# Patient Record
Sex: Female | Born: 1945 | Race: White | Hispanic: No | Marital: Married | Smoking: Former smoker
Health system: Southern US, Community
[De-identification: ages and names within clinical notes are randomized; demographics above are authoritative.]

## PROBLEM LIST (undated history)

## (undated) DIAGNOSIS — J449 Chronic obstructive pulmonary disease, unspecified: Secondary | ICD-10-CM

## (undated) DIAGNOSIS — I4891 Unspecified atrial fibrillation: Secondary | ICD-10-CM

## (undated) DIAGNOSIS — I82409 Acute embolism and thrombosis of unspecified deep veins of unspecified lower extremity: Secondary | ICD-10-CM

## (undated) DIAGNOSIS — I10 Essential (primary) hypertension: Secondary | ICD-10-CM

## (undated) HISTORY — PX: LOWER EXTREMITY ANGIOGRAM: SHX5955

## (undated) HISTORY — PX: APPENDECTOMY: SHX54

## (undated) HISTORY — PX: TONSILLECTOMY: SUR1361

---

## 2019-07-28 DIAGNOSIS — Z880 Allergy status to penicillin: Secondary | ICD-10-CM | POA: Insufficient documentation

## 2019-07-28 DIAGNOSIS — Z6833 Body mass index (BMI) 33.0-33.9, adult: Secondary | ICD-10-CM | POA: Diagnosis not present

## 2019-07-28 DIAGNOSIS — Z9119 Patient's noncompliance with other medical treatment and regimen: Secondary | ICD-10-CM | POA: Insufficient documentation

## 2019-07-28 DIAGNOSIS — E669 Obesity, unspecified: Secondary | ICD-10-CM | POA: Insufficient documentation

## 2019-07-28 DIAGNOSIS — I10 Essential (primary) hypertension: Secondary | ICD-10-CM | POA: Diagnosis not present

## 2019-07-28 DIAGNOSIS — I998 Other disorder of circulatory system: Secondary | ICD-10-CM | POA: Diagnosis not present

## 2019-07-28 DIAGNOSIS — Z7901 Long term (current) use of anticoagulants: Secondary | ICD-10-CM | POA: Diagnosis not present

## 2019-07-28 DIAGNOSIS — Z20822 Contact with and (suspected) exposure to covid-19: Secondary | ICD-10-CM | POA: Diagnosis not present

## 2019-07-28 DIAGNOSIS — G4733 Obstructive sleep apnea (adult) (pediatric): Secondary | ICD-10-CM | POA: Insufficient documentation

## 2019-07-28 DIAGNOSIS — I70222 Atherosclerosis of native arteries of extremities with rest pain, left leg: Secondary | ICD-10-CM | POA: Diagnosis not present

## 2019-07-28 DIAGNOSIS — Z86718 Personal history of other venous thrombosis and embolism: Secondary | ICD-10-CM | POA: Insufficient documentation

## 2019-07-28 DIAGNOSIS — Z87891 Personal history of nicotine dependence: Secondary | ICD-10-CM | POA: Insufficient documentation

## 2019-07-28 DIAGNOSIS — I482 Chronic atrial fibrillation, unspecified: Secondary | ICD-10-CM | POA: Insufficient documentation

## 2019-07-28 DIAGNOSIS — J449 Chronic obstructive pulmonary disease, unspecified: Secondary | ICD-10-CM | POA: Diagnosis not present

## 2019-07-28 DIAGNOSIS — M79622 Pain in left upper arm: Secondary | ICD-10-CM | POA: Insufficient documentation

## 2019-07-29 ENCOUNTER — Other Ambulatory Visit: Payer: Self-pay

## 2019-07-29 ENCOUNTER — Encounter
Admission: EM | Disposition: A | Discharge status: Home / Self Care | Payer: Self-pay | Source: Home / Self Care | Attending: Emergency Medicine

## 2019-07-29 ENCOUNTER — Encounter: Payer: Self-pay | Admitting: Certified Registered"

## 2019-07-29 ENCOUNTER — Observation Stay: Payer: Medicare Other

## 2019-07-29 ENCOUNTER — Observation Stay
Admission: EM | Admit: 2019-07-29 | Discharge: 2019-07-30 | Disposition: A | Discharge status: Home / Self Care | Payer: Medicare Other | Attending: Internal Medicine | Admitting: Internal Medicine

## 2019-07-29 ENCOUNTER — Other Ambulatory Visit (INDEPENDENT_AMBULATORY_CARE_PROVIDER_SITE_OTHER): Payer: Self-pay | Admitting: Vascular Surgery

## 2019-07-29 ENCOUNTER — Emergency Department: Payer: Medicare Other

## 2019-07-29 ENCOUNTER — Encounter: Payer: Self-pay | Admitting: *Deleted

## 2019-07-29 DIAGNOSIS — I739 Peripheral vascular disease, unspecified: Secondary | ICD-10-CM

## 2019-07-29 DIAGNOSIS — I998 Other disorder of circulatory system: Secondary | ICD-10-CM | POA: Diagnosis not present

## 2019-07-29 DIAGNOSIS — J449 Chronic obstructive pulmonary disease, unspecified: Secondary | ICD-10-CM

## 2019-07-29 DIAGNOSIS — I482 Chronic atrial fibrillation, unspecified: Secondary | ICD-10-CM

## 2019-07-29 DIAGNOSIS — G4733 Obstructive sleep apnea (adult) (pediatric): Secondary | ICD-10-CM

## 2019-07-29 DIAGNOSIS — M79606 Pain in leg, unspecified: Secondary | ICD-10-CM | POA: Diagnosis not present

## 2019-07-29 DIAGNOSIS — J4489 Other specified chronic obstructive pulmonary disease: Secondary | ICD-10-CM

## 2019-07-29 DIAGNOSIS — I70222 Atherosclerosis of native arteries of extremities with rest pain, left leg: Secondary | ICD-10-CM | POA: Diagnosis not present

## 2019-07-29 HISTORY — PX: LOWER EXTREMITY ANGIOGRAPHY: CATH118251

## 2019-07-29 HISTORY — DX: Acute embolism and thrombosis of unspecified deep veins of unspecified lower extremity: I82.409

## 2019-07-29 HISTORY — DX: Chronic obstructive pulmonary disease, unspecified: J44.9

## 2019-07-29 HISTORY — DX: Essential (primary) hypertension: I10

## 2019-07-29 HISTORY — DX: Unspecified atrial fibrillation: I48.91

## 2019-07-29 LAB — BASIC METABOLIC PANEL
Anion gap: 11 (ref 5–15)
BUN: 23 mg/dL (ref 8–23)
CO2: 26 mmol/L (ref 22–32)
Calcium: 9.6 mg/dL (ref 8.9–10.3)
Chloride: 104 mmol/L (ref 98–111)
Creatinine, Ser: 0.75 mg/dL (ref 0.44–1.00)
GFR calc Af Amer: >60 mL/min (ref >60)
GFR calc non Af Amer: >60 mL/min (ref >60)
Glucose, Bld: 99 mg/dL (ref 70–99)
Potassium: 4.4 mmol/L (ref 3.5–5.1)
Sodium: 141 mmol/L (ref 135–145)

## 2019-07-29 LAB — CBC
HCT: 38.2 % (ref 36.0–46.0)
Hemoglobin: 12 g/dL (ref 12.0–15.0)
MCH: 25.5 pg — ABNORMAL LOW (ref 26.0–34.0)
MCHC: 31.4 g/dL (ref 30.0–36.0)
MCV: 81.3 fL (ref 80.0–100.0)
Platelets: 214 10*3/uL (ref 150–400)
RBC: 4.7 MIL/uL (ref 3.87–5.11)
RDW: 16.3 % — ABNORMAL HIGH (ref 11.5–15.5)
WBC: 7.5 10*3/uL (ref 4.0–10.5)
nRBC: 0 % (ref 0.0–0.2)

## 2019-07-29 LAB — PROTIME-INR
INR: 1.1 (ref 0.8–1.2)
Prothrombin Time: 13.3 seconds (ref 11.4–15.2)

## 2019-07-29 LAB — APTT: aPTT: 29 seconds (ref 24–36)

## 2019-07-29 LAB — SARS CORONAVIRUS 2 BY RT PCR (HOSPITAL ORDER, PERFORMED IN ~~LOC~~ HOSPITAL LAB): SARS Coronavirus 2: NEGATIVE

## 2019-07-29 SURGERY — LOWER EXTREMITY ANGIOGRAPHY
Anesthesia: Moderate Sedation | Laterality: Left

## 2019-07-29 MED ORDER — CLINDAMYCIN PHOSPHATE 300 MG/50ML IV SOLN
300.0000 mg | Freq: Once | INTRAVENOUS | Status: AC
  Filled 2019-07-29: qty 50

## 2019-07-29 MED ORDER — SODIUM CHLORIDE 0.9 % IV SOLN: 250.0000 mL | INTRAVENOUS | Status: DC | PRN

## 2019-07-29 MED ORDER — TIROFIBAN HCL IV 12.5 MG/250 ML
INTRAVENOUS | Status: AC
  Administered 2019-07-29: 2267.5 ug via INTRAVENOUS
  Filled 2019-07-29: qty 250

## 2019-07-29 MED ORDER — DIPHENHYDRAMINE HCL 50 MG/ML IJ SOLN: 50.0000 mg | Freq: Once | INTRAMUSCULAR | Status: DC | PRN

## 2019-07-29 MED ORDER — LATANOPROST 0.005 % OP SOLN
1.0000 [drp] | Freq: Every day | OPHTHALMIC | Status: DC
  Administered 2019-07-30: 1 [drp] via OPHTHALMIC
  Filled 2019-07-29: qty 2.5

## 2019-07-29 MED ORDER — HYDROMORPHONE HCL 1 MG/ML IJ SOLN
INTRAMUSCULAR | Status: AC
  Filled 2019-07-29: qty 1

## 2019-07-29 MED ORDER — MIDAZOLAM HCL 2 MG/2ML IJ SOLN
INTRAMUSCULAR | Status: DC | PRN
  Administered 2019-07-29: 1 mg via INTRAVENOUS
  Administered 2019-07-29: 0.5 mg via INTRAVENOUS
  Administered 2019-07-29: 2 mg via INTRAVENOUS
  Administered 2019-07-29: 0.5 mg via INTRAVENOUS

## 2019-07-29 MED ORDER — FAMOTIDINE 20 MG PO TABS: 40.0000 mg | ORAL_TABLET | Freq: Once | ORAL | Status: DC | PRN

## 2019-07-29 MED ORDER — SODIUM CHLORIDE 0.9% FLUSH: 3.0000 mL | Freq: Once | INTRAVENOUS | Status: DC

## 2019-07-29 MED ORDER — DONEPEZIL HCL 5 MG PO TABS
10.0000 mg | ORAL_TABLET | Freq: Every day | ORAL | Status: DC
  Administered 2019-07-30: 10 mg via ORAL
  Filled 2019-07-29: qty 2

## 2019-07-29 MED ORDER — SODIUM CHLORIDE 0.9 % IV SOLN: INTRAVENOUS | Status: DC

## 2019-07-29 MED ORDER — MIDAZOLAM HCL 2 MG/ML PO SYRP: 8.0000 mg | ORAL_SOLUTION | Freq: Once | ORAL | Status: DC | PRN

## 2019-07-29 MED ORDER — OXYCODONE HCL 5 MG PO TABS: 5.0000 mg | ORAL_TABLET | ORAL | Status: DC | PRN

## 2019-07-29 MED ORDER — MORPHINE SULFATE (PF) 2 MG/ML IV SOLN: 2.0000 mg | INTRAVENOUS | Status: DC | PRN

## 2019-07-29 MED ORDER — CLINDAMYCIN PHOSPHATE 300 MG/50ML IV SOLN
INTRAVENOUS | Status: AC
  Administered 2019-07-29: 300 mg via INTRAVENOUS
  Filled 2019-07-29: qty 50

## 2019-07-29 MED ORDER — CLOPIDOGREL BISULFATE 75 MG PO TABS
75.0000 mg | ORAL_TABLET | Freq: Every day | ORAL | Status: DC
  Administered 2019-07-30: 75 mg via ORAL
  Filled 2019-07-29: qty 1

## 2019-07-29 MED ORDER — ACETAMINOPHEN 325 MG PO TABS
650.0000 mg | ORAL_TABLET | ORAL | Status: DC | PRN
  Administered 2019-07-30: 650 mg via ORAL
  Filled 2019-07-29: qty 2

## 2019-07-29 MED ORDER — HEPARIN (PORCINE) 25000 UT/250ML-% IV SOLN
1100.0000 [IU]/h | INTRAVENOUS | Status: DC
  Administered 2019-07-29: 1100 [IU]/h via INTRAVENOUS
  Filled 2019-07-29: qty 250

## 2019-07-29 MED ORDER — HEPARIN SODIUM (PORCINE) 1000 UNIT/ML IJ SOLN
INTRAMUSCULAR | Status: AC
  Filled 2019-07-29: qty 1

## 2019-07-29 MED ORDER — IPRATROPIUM-ALBUTEROL 0.5-2.5 (3) MG/3ML IN SOLN: 3.0000 mL | RESPIRATORY_TRACT | Status: DC | PRN

## 2019-07-29 MED ORDER — ONDANSETRON HCL 4 MG/2ML IJ SOLN: 4.0000 mg | Freq: Four times a day (QID) | INTRAMUSCULAR | Status: DC | PRN

## 2019-07-29 MED ORDER — VITAMIN D (ERGOCALCIFEROL) 1.25 MG (50000 UNIT) PO CAPS: 50000.0000 [IU] | ORAL_CAPSULE | ORAL | Status: DC

## 2019-07-29 MED ORDER — FENTANYL CITRATE (PF) 100 MCG/2ML IJ SOLN
INTRAMUSCULAR | Status: DC | PRN
  Administered 2019-07-29 (×4): 25 ug via INTRAVENOUS
  Administered 2019-07-29: 50 ug via INTRAVENOUS

## 2019-07-29 MED ORDER — TIROFIBAN (AGGRASTAT) BOLUS VIA INFUSION
25.0000 ug/kg | Freq: Once | INTRAVENOUS | Status: AC
  Filled 2019-07-29: qty 46

## 2019-07-29 MED ORDER — LOSARTAN POTASSIUM 50 MG PO TABS
100.0000 mg | ORAL_TABLET | Freq: Every day | ORAL | Status: DC
  Administered 2019-07-30: 100 mg via ORAL
  Filled 2019-07-29: qty 2

## 2019-07-29 MED ORDER — ALBUTEROL SULFATE (2.5 MG/3ML) 0.083% IN NEBU: 2.5000 mg | INHALATION_SOLUTION | Freq: Four times a day (QID) | RESPIRATORY_TRACT | Status: DC | PRN

## 2019-07-29 MED ORDER — FENTANYL CITRATE (PF) 100 MCG/2ML IJ SOLN
INTRAMUSCULAR | Status: AC
  Filled 2019-07-29: qty 2

## 2019-07-29 MED ORDER — ONDANSETRON HCL 4 MG PO TABS: 4.0000 mg | ORAL_TABLET | Freq: Four times a day (QID) | ORAL | Status: DC | PRN

## 2019-07-29 MED ORDER — METHYLPREDNISOLONE SODIUM SUCC 125 MG IJ SOLR: 125.0000 mg | Freq: Once | INTRAMUSCULAR | Status: DC | PRN

## 2019-07-29 MED ORDER — SODIUM CHLORIDE 0.9% FLUSH: 3.0000 mL | Freq: Two times a day (BID) | INTRAVENOUS | Status: DC

## 2019-07-29 MED ORDER — MIDAZOLAM HCL 5 MG/5ML IJ SOLN
INTRAMUSCULAR | Status: AC
  Filled 2019-07-29: qty 5

## 2019-07-29 MED ORDER — NITROGLYCERIN 1 MG/10 ML FOR IR/CATH LAB
INTRA_ARTERIAL | Status: DC | PRN
  Administered 2019-07-29: 300 ug

## 2019-07-29 MED ORDER — NITROGLYCERIN 1 MG/10 ML FOR IR/CATH LAB
INTRA_ARTERIAL | Status: AC
  Filled 2019-07-29: qty 10

## 2019-07-29 MED ORDER — LABETALOL HCL 5 MG/ML IV SOLN: 10.0000 mg | INTRAVENOUS | Status: DC | PRN

## 2019-07-29 MED ORDER — HYDRALAZINE HCL 20 MG/ML IJ SOLN
5.0000 mg | INTRAMUSCULAR | Status: DC | PRN
  Administered 2019-07-29: 5 mg via INTRAVENOUS
  Filled 2019-07-29: qty 1

## 2019-07-29 MED ORDER — TIROFIBAN HCL IN NACL 5-0.9 MG/100ML-% IV SOLN
0.1500 ug/kg/min | INTRAVENOUS | Status: AC
  Administered 2019-07-30: 0.15 ug/kg/min via INTRAVENOUS
  Filled 2019-07-29 (×3): qty 100

## 2019-07-29 MED ORDER — CEFAZOLIN SODIUM-DEXTROSE 2-4 GM/100ML-% IV SOLN
INTRAVENOUS | Status: AC
  Filled 2019-07-29: qty 100

## 2019-07-29 MED ORDER — HEPARIN SODIUM (PORCINE) 1000 UNIT/ML IJ SOLN
INTRAMUSCULAR | Status: DC | PRN
  Administered 2019-07-29: 4000 [IU] via INTRAVENOUS

## 2019-07-29 MED ORDER — SODIUM CHLORIDE 0.9% FLUSH: 3.0000 mL | INTRAVENOUS | Status: DC | PRN

## 2019-07-29 MED ORDER — HYDROMORPHONE HCL 1 MG/ML IJ SOLN
1.0000 mg | Freq: Once | INTRAMUSCULAR | Status: AC | PRN
  Administered 2019-07-29: 1 mg via INTRAVENOUS

## 2019-07-29 MED ORDER — HEPARIN BOLUS VIA INFUSION
4000.0000 [IU] | Freq: Once | INTRAVENOUS | Status: AC
  Administered 2019-07-29: 4000 [IU] via INTRAVENOUS
  Filled 2019-07-29: qty 4000

## 2019-07-29 MED ORDER — SODIUM CHLORIDE 0.9 % IV SOLN: INTRAVENOUS | Status: AC

## 2019-07-29 SURGICAL SUPPLY — 28 items
BALLN LUTONIX 018 4X150X130 (BALLOONS) ×2
BALLN LUTONIX 018 4X220X130 (BALLOONS) ×2
BALLN LUTONIX 018 4X300X130 (BALLOONS) ×2
BALLN LUTONIX 018 5X220X130 (BALLOONS) ×2
BALLN ULTRVRSE 3X300X150 (BALLOONS) ×1
BALLN ULTRVRSE 3X300X150 OTW (BALLOONS) ×1
BALLOON LUTONIX 018 4X150X130 (BALLOONS) ×1 IMPLANT
BALLOON LUTONIX 018 4X220X130 (BALLOONS) ×1 IMPLANT
BALLOON LUTONIX 018 4X300X130 (BALLOONS) ×1 IMPLANT
BALLOON LUTONIX 018 5X220X130 (BALLOONS) ×1 IMPLANT
BALLOON ULTRVRSE 3X300X150 OTW (BALLOONS) ×1 IMPLANT
CATH ANGIO 5F PIGTAIL 65CM (CATHETERS) ×2 IMPLANT
CATH SEEKER .035X135CM (CATHETERS) ×2 IMPLANT
DEVICE PRESTO INFLATION (MISCELLANEOUS) ×2 IMPLANT
DEVICE SAFEGUARD 24CM (GAUZE/BANDAGES/DRESSINGS) ×4 IMPLANT
DEVICE STARCLOSE SE CLOSURE (Vascular Products) ×2 IMPLANT
GLIDEWIRE ADV .035X260CM (WIRE) ×2 IMPLANT
LIFESTENT SOLO 6X200X135 (Permanent Stent) ×4 IMPLANT
NEEDLE ENTRY 21GA 7CM ECHOTIP (NEEDLE) ×2 IMPLANT
PACK ANGIOGRAPHY (CUSTOM PROCEDURE TRAY) ×2 IMPLANT
SET INTRO CAPELLA COAXIAL (SET/KITS/TRAYS/PACK) ×2 IMPLANT
SHEATH ANL2 6FRX45 HC (SHEATH) ×2 IMPLANT
SHEATH BRITE TIP 5FRX11 (SHEATH) ×2 IMPLANT
SYR MEDRAD MARK 7 150ML (SYRINGE) ×2 IMPLANT
TUBING CONTRAST HIGH PRESS 72 (TUBING) ×2 IMPLANT
VALVE HEMO TOUHY BORST Y (ADAPTER) ×2 IMPLANT
WIRE G V18X300CM (WIRE) ×2 IMPLANT
WIRE J 3MM .035X145CM (WIRE) ×2 IMPLANT

## 2019-07-29 NOTE — ED Provider Notes (Signed)
[facility] Emergency Department Provider Note  ____________________________________________  Time seen: Approximately 5:42 AM  I have reviewed the triage vital signs and the nursing notes.   HISTORY  Chief Complaint Leg Pain   HPI Kelli Morgan is a 74 y.o. female with a history of PAD and A. fib on Eliquis, COPD, DVT, HTN who presents for evaluation of left lower extremity pain. Patient has recently moved from [facility] to [facility] and does not have any doctors here. She reports having balloon angioplasty of her left lower extremity back in February. Over the process of moving she has forgotten to take her Eliquis for several days in the last week. She has had pain in the leg over the last 3 weeks however over the last week since missing several doses of her Eliquis the pain has become severe. The pain is worse at nighttime, sharp, 10 out of 10. At this time pain is mild. She reports that the pain is also bad if she stands for long periods of time. Patient has also had DVTs on her right lower extremity.  Past Medical History:  Diagnosis Date  . A-fib (HCC)   . COPD (chronic obstructive pulmonary disease) (HCC)   . DVT (deep venous thrombosis) (HCC)   . Hypertension     Patient Active Problem List   Diagnosis Date Noted  . Ischemic leg pain 07/29/2019  . PVD (peripheral vascular disease) (HCC) 07/29/2019  . COPD with chronic bronchitis (HCC) 07/29/2019  . Atrial fibrillation, chronic (HCC) 07/29/2019  . OSA on CPAP 07/29/2019    Past Surgical History:  Procedure Laterality Date  . LOWER EXTREMITY ANGIOGRAM      Prior to Admission medications   Not on File    Allergies Penicillins  History reviewed. No pertinent family history.  Social History Social History   Tobacco Use  . Smoking status: Former Games developer  . Smokeless tobacco: Never Used  Vaping Use  . Vaping Use: Never used  Substance Use Topics  . Alcohol use: Yes     Comment: occasionally  . Drug use: Not on file    Review of Systems  Constitutional: Negative for fever. Eyes: Negative for visual changes. ENT: Negative for sore throat. Neck: No neck pain  Cardiovascular: Negative for chest pain. Respiratory: Negative for shortness of breath. Gastrointestinal: Negative for abdominal pain, vomiting or diarrhea. Genitourinary: Negative for dysuria. Musculoskeletal: Negative for back pain. + LLE pain Skin: Negative for rash. Neurological: Negative for headaches, weakness or numbness. Psych: No SI or HI  ____________________________________________   PHYSICAL EXAM:  VITAL SIGNS: ED Triage Vitals  Enc Vitals Group     BP 07/29/19 0031 (!) 185/86     Pulse Rate 07/29/19 0031 75     Resp 07/29/19 0031 16     Temp 07/29/19 0031 98.5 F (36.9 C)     Temp Source 07/29/19 0031 Oral     SpO2 07/29/19 0031 97 %     Weight 07/29/19 0032 200 lb (90.7 kg)     Height 07/29/19 0032 5\' 5"  (1.651 m)     Head Circumference --      Peak Flow --      Pain Score 07/29/19 0030 0     Pain Loc --      Pain Edu? --      Excl. in GC? --     Constitutional: Alert and oriented. Well appearing and in no apparent distress. HEENT:      Head:  Normocephalic and atraumatic.         Eyes: Conjunctivae are normal. Sclera is non-icteric.       Mouth/Throat: Mucous membranes are moist.       Neck: Supple with no signs of meningismus. Cardiovascular: Regular rate and rhythm. No murmurs, gallops, or rubs.  Respiratory: Normal respiratory effort. Lungs are clear to auscultation bilaterally.  Gastrointestinal: Soft, non tender. Musculoskeletal: No palpable or dopplerable PT, DP, or popliteal pulses on the LLE. Patient has palpable L femoral pulse. Foot is slightly pale and toes are slightly bluish with cap refill of 5s. The foot is otherwise still warm. There is no pitting edema or warmth. Neurologic: Normal speech and language. Face is symmetric. Moving all extremities.  No gross focal neurologic deficits are appreciated. Skin: Skin is warm, dry and intact. No rash noted. Psychiatric: Mood and affect are normal. Speech and behavior are normal.  ____________________________________________   LABS (all labs ordered are listed, but only abnormal results are displayed)  Labs Reviewed  CBC - Abnormal; Notable for the following components:      Result Value   MCH 25.5 (*)    RDW 16.3 (*)    All other components within normal limits  SARS CORONAVIRUS 2 BY RT PCR (HOSPITAL ORDER, PERFORMED IN [facility])  BASIC METABOLIC PANEL  PROTIME-INR  APTT  APTT   ____________________________________________  EKG  ED ECG REPORT I, Nita Sickle, the attending physician, personally viewed and interpreted this ECG.  Normal sinus rhythm, rate of 68, normal intervals, normal axis, no ST elevations or depressions. Normal EKG. ____________________________________________  RADIOLOGY  I have personally reviewed the images performed during this visit and I agree with the Radiologist's read.   Interpretation by Radiologist:  DG Chest 2 View  Result Date: 07/29/2019 CLINICAL DATA:  Left leg pain EXAM: CHEST - 2 VIEW COMPARISON:  None. FINDINGS: The heart size and mediastinal contours are within normal limits. Both lungs are clear. The visualized skeletal structures are unremarkable. IMPRESSION: No active cardiopulmonary disease. Electronically Signed   By: Alcide Clever M.D.   On: 07/29/2019 01:02   US Venous Img Lower Unilateral Left  Result Date: 07/29/2019 CLINICAL DATA:  Left leg pain. EXAM: LEFT LOWER EXTREMITY VENOUS DOPPLER ULTRASOUND TECHNIQUE: Gray-scale sonography with compression, as well as color and duplex ultrasound, were performed to evaluate the deep venous system(s) from the level of the common femoral vein through the popliteal and proximal calf veins. COMPARISON:  None. FINDINGS: VENOUS Normal compressibility of the common femoral,  superficial femoral, and popliteal veins, as well as the visualized calf veins. Visualized portions of profunda femoral vein and great saphenous vein unremarkable. No filling defects to suggest DVT on grayscale or color Doppler imaging. Doppler waveforms show normal direction of venous flow, normal respiratory plasticity and response to augmentation. Limited views of the contralateral common femoral vein are unremarkable. OTHER None. IMPRESSION: Negative. Electronically Signed   By: Maisie Fus  Register   On: 07/29/2019 06:45     ____________________________________________   PROCEDURES  Procedure(s) performed:yes .1-3 Lead EKG Interpretation Performed by: Nita Sickle, MD Authorized by: Nita Sickle, MD     Interpretation: normal     ECG rate assessment: normal     Rhythm: sinus rhythm     Ectopy: none     Critical Care performed: yes  CRITICAL CARE Performed by: Nita Sickle  ?  Total critical care time: 35 min  Critical care time was exclusive of separately billable procedures and treating other  patients.  Critical care was necessary to treat or prevent imminent or life-threatening deterioration.  Critical care was time spent personally by me on the following activities: development of treatment plan with patient and/or surrogate as well as nursing, discussions with consultants, evaluation of patient's response to treatment, examination of patient, obtaining history from patient or surrogate, ordering and performing treatments and interventions, ordering and review of laboratory studies, ordering and review of radiographic studies, pulse oximetry and re-evaluation of patient's condition.  ____________________________________________   INITIAL IMPRESSION / ASSESSMENT AND PLAN / ED COURSE   74 y.o. female with a history of PAD and A. fib on Eliquis, COPD, DVT, HTN who presents for evaluation of left lower extremity pain. Patient has no dopplerable or palpable  popliteal, PT or DP pulses in the left. The left foot slightly pale and toes are slightly bluish in coloration when compared to the right. The foot and the leg are otherwise warm. Cap refill slightly delayed at 5s. At this time presentation is concerning for an arterial occlusion. Discussed with Dr. Gilda Crease who was recommended keeping patient n.p.o., start patient on heparin and get admitted to the hospitalist service for angiogram this morning. Discussed with Dr. Para March who has accepted patient to her service. Unfortunately patient is old medical records are not available. History is gathered from her and her husband who are at bedside. Care discussed with both of them. We will do venous Doppler just for completeness of work-up since patient does have a history of DVT and has missed several doses of Eliquis.  _________________________ 6:55 AM on 07/29/2019 -----------------------------------------  Venous Doppler negative for DVT.  Patient admitted to the hospitalist.    _____________________________________________ Please note:  Patient was evaluated in Emergency Department today for the symptoms described in the history of present illness. Patient was evaluated in the context of the global COVID-19 pandemic, which necessitated consideration that the patient might be at risk for infection with the SARS-CoV-2 virus that causes COVID-19. Institutional protocols and algorithms that pertain to the evaluation of patients at risk for COVID-19 are in a state of rapid change based on information released by regulatory bodies including the CDC and federal and state organizations. These policies and algorithms were followed during the patient's care in the ED.  Some ED evaluations and interventions may be delayed as a result of limited staffing during the pandemic.   [place] Controlled Substance Database was reviewed by me. ____________________________________________   FINAL CLINICAL IMPRESSION(S) / ED  DIAGNOSES   Final diagnoses:  Limb ischemia  Claudication in peripheral vascular disease (HCC)      NEW MEDICATIONS STARTED DURING THIS VISIT:  ED Discharge Orders    None       Note:  This document was prepared using Dragon voice recognition software and may include unintentional dictation errors.    Don Perking, [place], MD 07/29/19 [phone]

## 2019-07-29 NOTE — Consult Note (Signed)
[facility] Vascular Consult Note  MRN : [id number]  Kelli Morgan is a 74 y.o. ([birth year]) female who presents with chief complaint of  Chief Complaint  Patient presents with  . Leg Pain   History of Present Illness:  Kelli Morgan is a 74 y.o. female, recently relocated from [place] with medical history significant for peripheral vascular disease status post bilateral lower extremity angioplasty in February 2021, OSA on CPAP, history of DVT, COPD and A. fib status post ablation x2, on chronic anticoagulation with Eliquis who presents to the emergency room with pain left leg, with ambulation bluish discoloration to the leg.  The pain is sharp, severe, worse at nights and with ambulation or standing for long periods of time.  She denies chest pain or shortness of breath.  Reports that she has not been compliant with her Eliquis on account of the recent move.    Vascular surgery was consulted by Dr. Para March for possible endovascular revascularization.  Current Facility-Administered Medications  Medication Dose Route Frequency Provider Last Rate Last Admin  . clindamycin (CLEOCIN) 300 MG/50ML IVPB           . 0.9 %  sodium chloride infusion   Intravenous Continuous Lindajo Royal V, MD      . 0.9 %  sodium chloride infusion   Intravenous Continuous Swetha Rayle, Ranae Plumber, PA-C      . [START ON 07/30/2019] clindamycin (CLEOCIN) IVPB 300 mg  300 mg Intravenous Once Hezakiah Champeau A, PA-C      . diphenhydrAMINE (BENADRYL) injection 50 mg  50 mg Intravenous Once PRN Cloyce Paterson A, PA-C      . famotidine (PEPCID) tablet 40 mg  40 mg Oral Once PRN Isair Inabinet A, PA-C      . heparin ADULT infusion 100 units/mL (25000 units/239mL sodium chloride 0.45%)  1,100 Units/hr Intravenous Continuous Andris Baumann, MD 11 mL/hr at 07/29/19 0549 1,100 Units/hr at 07/29/19 0549  . [MAR Hold] HYDROmorphone (DILAUDID) injection 1 mg  1 mg Intravenous Once PRN  Roseland Braun, Ranae Plumber, PA-C      . [MAR Hold] ipratropium-albuterol (DUONEB) 0.5-2.5 (3) MG/3ML nebulizer solution 3 mL  3 mL Nebulization Q4H PRN Andris Baumann, MD      . methylPREDNISolone sodium succinate (SOLU-MEDROL) 125 mg/2 mL injection 125 mg  125 mg Intravenous Once PRN Anthonette Lesage A, PA-C      . midazolam (VERSED) 2 MG/ML syrup 8 mg  8 mg Oral Once PRN Adleigh Mcmasters, Ranae Plumber, PA-C      . [MAR Hold] morphine 2 MG/ML injection 2 mg  2 mg Intravenous Q2H PRN Andris Baumann, MD      . Mitzi Hansen Hold] ondansetron [facility]) tablet 4 mg  4 mg Oral Q6H PRN Andris Baumann, MD       Or  . Mitzi Hansen Hold] ondansetron [facility]) injection 4 mg  4 mg Intravenous Q6H PRN Andris Baumann, MD      . Mitzi Hansen Hold] ondansetron [facility]) injection 4 mg  4 mg Intravenous Q6H PRN Izel Eisenhardt, Ranae Plumber, PA-C      . [MAR Hold] sodium chloride flush (NS) 0.9 % injection 3 mL  3 mL Intravenous Once Nita Sickle, MD       Past Medical History:  Diagnosis Date  . A-fib (HCC)   . COPD (chronic obstructive pulmonary disease) (HCC)   . DVT (deep venous thrombosis) (HCC)   . Hypertension    Past Surgical History:  Procedure Laterality Date  .  LOWER EXTREMITY ANGIOGRAM     Social History Social History   Tobacco Use  . Smoking status: Former Games developer  . Smokeless tobacco: Never Used  Vaping Use  . Vaping Use: Never used  Substance Use Topics  . Alcohol use: Yes    Comment: occasionally  . Drug use: Not on file   Family History History reviewed. No pertinent family history.  Denies family history of peripheral artery disease, venous disease or renal disease.  Allergies  Allergen Reactions  . Penicillins Anaphylaxis  . Tape Rash and Other (See Comments)    Band-Aid, medical tape, etc. / Severe rash, open wounds    REVIEW OF SYSTEMS (Negative unless checked)  Constitutional: [] Weight loss  [] Fever  [] Chills Cardiac: [] Chest pain   [] Chest pressure   [] Palpitations   [] Shortness of breath when  laying flat   [] Shortness of breath at rest   [] Shortness of breath with exertion. Vascular:  [x] Pain in legs with walking   [] Pain in legs at rest   [] Pain in legs when laying flat   [x] Claudication   [] Pain in feet when walking  [] Pain in feet at rest  [] Pain in feet when laying flat   [] History of DVT   [] Phlebitis   [] Swelling in legs   [] Varicose veins   [] Non-healing ulcers Pulmonary:   [] Uses home oxygen   [] Productive cough   [] Hemoptysis   [] Wheeze  [] COPD   [] Asthma Neurologic:  [] Dizziness  [] Blackouts   [] Seizures   [] History of stroke   [] History of TIA  [] Aphasia   [] Temporary blindness   [] Dysphagia   [] Weakness or numbness in arms   [] Weakness or numbness in legs Musculoskeletal:  [x] Arthritis   [] Joint swelling   [] Joint pain   [] Low back pain Hematologic:  [] Easy bruising  [] Easy bleeding   [] Hypercoagulable state   [] Anemic  [] Hepatitis Gastrointestinal:  [] Blood in stool   [] Vomiting blood  [] Gastroesophageal reflux/heartburn   [] Difficulty swallowing. Genitourinary:  [] Chronic kidney disease   [] Difficult urination  [] Frequent urination  [] Burning with urination   [] Blood in urine Skin:  [] Rashes   [] Ulcers   [] Wounds Psychological:  [] History of anxiety   []  History of major depression.  Physical Examination  Vitals:   07/29/19 1000 07/29/19 1100 07/29/19 1200 07/29/19 1223  BP: (!) 168/80 (!) 170/91 (!) 177/89 (!) 220/96  Pulse: 65 72 85 98  Resp: 16 19 18 19   Temp:      TempSrc:      SpO2: 93% 96% 96% 98%  Weight:      Height:       Body mass index is 33.28 kg/m. Gen:  WD/WN, NAD Head: Stamps/AT, No temporalis wasting. Prominent temp pulse not noted. Ear/Nose/Throat: Hearing grossly intact, nares w/o erythema or drainage, oropharynx w/o Erythema/Exudate Eyes: Sclera non-icteric, conjunctiva clear Neck: Trachea midline.  No JVD.  Pulmonary:  Good air movement, respirations not labored, equal bilaterally.  Cardiac: RRR, normal S1, S2. Vascular:  Vessel Right Left   Radial Palpable Palpable  Ulnar Palpable Palpable  Brachial Palpable Palpable  Carotid Palpable, without bruit Palpable, without bruit  Aorta Not palpable N/A  Femoral Palpable Palpable  Popliteal Palpable Palpable  PT Palpable Non-Palpable  DP Palpable Non-Palpable    Left lower extremity: Thigh soft.  Calf soft.  Unable to palpate pedal pulses. Poor capillary refill.  Motor/sensory is intact  Gastrointestinal: soft, non-tender/non-distended. No guarding/reflex.  Musculoskeletal: M/S 5/5 throughout.  Extremities without ischemic changes.  No deformity or atrophy. No edema.  Neurologic: Sensation grossly intact in extremities.  Symmetrical.  Speech is fluent. Motor exam as listed above. Psychiatric: Judgment intact, Mood & affect appropriate for pt's clinical situation. Dermatologic: No rashes or ulcers noted.  No cellulitis or open wounds. Lymph : No Cervical, Axillary, or Inguinal lymphadenopathy.  CBC Lab Results  Component Value Date   WBC 7.5 07/29/2019   HGB 12.0 07/29/2019   HCT 38.2 07/29/2019   MCV 81.3 07/29/2019   PLT 214 07/29/2019   BMET    Component Value Date/Time   NA 141 07/29/2019 0043   K 4.4 07/29/2019 0043   CL 104 07/29/2019 0043   CO2 26 07/29/2019 0043   GLUCOSE 99 07/29/2019 0043   BUN 23 07/29/2019 0043   CREATININE 0.75 07/29/2019 0043   CALCIUM 9.6 07/29/2019 0043   GFRNONAA >60 07/29/2019 0043   GFRAA >60 07/29/2019 0043   Estimated Creatinine Clearance: 67.6 mL/min (by C-G formula based on SCr of 0.75 mg/dL).  COAG Lab Results  Component Value Date   INR 1.1 07/29/2019   Radiology DG Chest 2 View  Result Date: 07/29/2019 CLINICAL DATA:  Left leg pain EXAM: CHEST - 2 VIEW COMPARISON:  None. FINDINGS: The heart size and mediastinal contours are within normal limits. Both lungs are clear. The visualized skeletal structures are unremarkable. IMPRESSION: No active cardiopulmonary disease. Electronically Signed   By: Alcide Clever M.D.   On:  07/29/2019 01:02   US Venous Img Lower Unilateral Left  Result Date: 07/29/2019 CLINICAL DATA:  Left leg pain. EXAM: LEFT LOWER EXTREMITY VENOUS DOPPLER ULTRASOUND TECHNIQUE: Gray-scale sonography with compression, as well as color and duplex ultrasound, were performed to evaluate the deep venous system(s) from the level of the common femoral vein through the popliteal and proximal calf veins. COMPARISON:  None. FINDINGS: VENOUS Normal compressibility of the common femoral, superficial femoral, and popliteal veins, as well as the visualized calf veins. Visualized portions of profunda femoral vein and great saphenous vein unremarkable. No filling defects to suggest DVT on grayscale or color Doppler imaging. Doppler waveforms show normal direction of venous flow, normal respiratory plasticity and response to augmentation. Limited views of the contralateral common femoral vein are unremarkable. OTHER None. IMPRESSION: Negative. Electronically Signed   By: Maisie Fus  Register   On: 07/29/2019 06:45   Assessment/Plan Patient is a 74 year old female with known history of peripheral artery disease noncompliant with Eliquis who presents to emergency room with aggressively worsening left lower extremity pain  1.  Left lower extremity ischemia: Patient with known history of peripheral artery disease who presents with progressively worsening pain to the right lower extremity.  Unable to palpate popliteal and pedal pulses on exam.  Poor capillary refill.  Recommend a left lower extremity angiogram with possible intervention and attempt assess the contributing degree of peripheral artery disease and if appropriate in her attempt to revascularize leg made at that time.  Procedure, risks and benefits were explained to the patient.  All questions were answered.  Patient wishes to proceed.  2.  A. Fib: Stressed importance of compliance with anticoagulation.  Explained to the patient that noncompliance with blood thinning  medication can lead to embolic events continued ischemia /loss of limb.  3.  Hypertension: On appropriate medications Encouraged good control as its slows the progression of atherosclerotic disease  Discussed with Dr. Romie Jumper, PA-C  07/29/2019 12:32 PM  This note was created with Dragon medical transcription system.  Any error is purely unintentional

## 2019-07-29 NOTE — Op Note (Signed)
[facility] Percutaneous Study/Intervention Procedural Note   Date of Surgery: 07/29/2019  Surgeon:  Katha Cabal, MD.  Pre-operative Diagnosis: Atherosclerotic occlusive disease bilateral lower extremities with rest pain of the left lower extremity; ischemia of the left lower extremity status post intervention at an outside facility  Post-operative diagnosis: Same  Procedure(s) Performed: 1. Introduction catheter into left lower extremity 3rd order catheter placement  2. Contrast injection left lower extremity for distal runoff   3. Percutaneous transluminal angioplasty and stent placement left superficial femoral artery and popliteal 4. Star close closure right common femoral arteriotomy  Anesthesia: Conscious sedation was administered under my direct supervision by the interventional radiology RN. IV Versed plus fentanyl were utilized. Continuous ECG, pulse oximetry and blood pressure was monitored throughout the entire procedure.  Conscious sedation was for a total of 85 minutes.  Sheath: 6 [place] Ansell retrograde right common femoral  Contrast: 60 cc  Fluoroscopy Time: 10.3 minutes  Indications: Kelli Morgan presents with ischemic rest pain of the left lower extremity.  She recently moved to [place] from [facility].  She reports that 2 to 3 months ago she underwent a treatment of her left lower extremity she reported that she had a stent placed at that time.  Today in the emergency room she presents with 3 to 4 days of increasing pain in the left lower extremity.  Her pedal pulses are nonpalpable.  Doppler signals are faint and her foot is pale and cold consistent with ischemia.  She has been started on heparin and we will proceed with angiography with the hope for intervention and limb salvage.  The risks and benefits are reviewed all questions answered patient agrees to  proceed.  Procedure: Kelli Morgan is a 74 y.o. y.o. female who was identified and appropriate procedural time out was performed. The patient was then placed supine on the table and prepped and draped in the usual sterile fashion.   Ultrasound was placed in the sterile sleeve and the right groin was evaluated the right common femoral artery was echolucent and pulsatile indicating patency.  Image was recorded for the permanent record and under real-time visualization a microneedle was inserted into the common femoral artery microwire followed by a micro-sheath.  A J-wire was then advanced through the micro-sheath and a  5 [place] sheath was then inserted over a J-wire. J-wire was then advanced and a 5 French pigtail catheter was positioned at the level of T12. AP projection of the aorta was then obtained. Pigtail catheter was repositioned to above the bifurcation and a RAO view of the pelvis was obtained.  Subsequently a pigtail catheter with the advantage Glidewire was used to cross the aortic bifurcation the catheter wire were advanced down into the left distal external iliac artery. Oblique view of the femoral bifurcation was then obtained and subsequently the wire was reintroduced and the pigtail catheter negotiated into the SFA representing third order catheter placement. Distal runoff was then performed.  Diagnostic interpretation: The abdominal aorta is opacified with a bolus injection contrast.  It is smooth in contour with mild atherosclerotic changes noted but there are no hemodynamically significant stenoses.  The bilateral common internal and external iliac arteries are all patent with diffuse atherosclerotic changes but no hemodynamically significant stenoses.  The left common femoral and profunda femoris are patent again with atherosclerotic changes but no hemodynamically significant lesion.  The left superficial femoral artery and popliteal artery is severely diseased with multiple  segments that  are greater than 99% stenotic.  This extends from 2 cm below the origin down to the level of the tibial plateau.  There is poor collateralization of the profunda femoris to the below-knee arterial system.  The trifurcation appears patent and there is three-vessel runoff although the anterior tibial and posterior tibial are small and diffusely diseased.  I do not see any focal hemodynamically significant lesions.  The peroneal is the dominant runoff and appears to be fairly healthy.  Based on these images I elected to move forward with intervention for limb salvage.  4000 units of heparin was then given and allowed to circulate and a 6 [place] Ansell sheath was advanced up and over the bifurcation and positioned in the femoral artery  Seeker catheter and advantage Glidewire were then negotiated down into the distal popliteal.  Distal runoff was then completed by hand injection through the catheter. The V 18 wire was then reintroduced and a 3 mm x 30 cm balloon was used to angioplasty the superficial femoral and popliteal arteries. Inflations were to 8-10 atmospheres for 1 minute. Follow-up imaging demonstrated patency with adequate preparation of the vessel for a drug-coated balloon.  Subsequently, a series of Lutonix Lutonix balloon was utilized inflating to 12 atm for 1 minute.  These balloons were all 4 mm in diameter.  Follow-up imaging demonstrated greater than 50% residual stenosis at 5 or 6 locations and therefore a series of life stents were deployed (beginning at the level of the tibial plateau within the mid to distal popliteal and extending all the way to the origin of the SFA) and subsequently postdilated with a 5 mm Lutonix drug-eluting balloon proximally and a 4 mm Lutonix drug-eluting balloon distally. Distal runoff was then reassessed.  The SFA and popliteal are now widely patent.  Trifurcation is unchanged however his flow is fairly sluggish consistent with spasm and 300 mcg of  nitroglycerin is administered.  Follow-up imaging demonstrates a marked improvement in distal flow.  After review of these images the sheath is pulled into the right external iliac oblique of the common femoral is obtained and a Star close device deployed. There no immediate complications.   Findings:  The abdominal aorta is opacified with a bolus injection contrast.  It is smooth in contour with mild atherosclerotic changes noted but there are no hemodynamically significant stenoses.  The bilateral common internal and external iliac arteries are all patent with diffuse atherosclerotic changes but no hemodynamically significant stenoses.  The left common femoral and profunda femoris are patent again with atherosclerotic changes but no hemodynamically significant lesion.  The left superficial femoral artery and popliteal artery is severely diseased with multiple segments that are greater than 99% stenotic.  This extends from 2 cm below the origin down to the level of the tibial plateau.  There is poor collateralization of the profunda femoris to the below-knee arterial system.  The trifurcation appears patent and there is three-vessel runoff although the anterior tibial and posterior tibial are small and diffusely diseased.  I do not see any focal hemodynamically significant lesions.  The peroneal is the dominant runoff and appears to be fairly healthy.  Following angioplasty the left SFA and popliteal now is in-line flow but has multiple high-grade stenoses as described above and therefore life stents are deployed extending from the origin of the SFA down to the mid to distal popliteal.  These were postdilated with 5 mm Lutonix drug-eluting balloon proximally and a 4 mm Lutonix distally.  Follow-up imaging demonstrated  wide patency with less than 10% residual stenosis and preservation runoff.    Summary: Successful recanalization left lower extremity for limb  salvage    Disposition: Patient was taken to the recovery room in stable condition having tolerated the procedure well.  Venessa Wickham, Dolores Lory 07/29/2019,2:58 PM

## 2019-07-29 NOTE — ED Triage Notes (Signed)
Pt presents w/ c/o L leg pain. Pt has PMH of peripheral vascular disease and has had surgery performed on L leg to restore blood flow. Pt takes eliquis to treat chronically occurring DVT's. Pt may have missed doses of her eliquis due to recent move. Pt knows that she has not taken the eliquis today.

## 2019-07-29 NOTE — H&P (Signed)
History and Physical    Kelli Morgan KNL:[id number] DOB: 16-May-1945 DOA: 07/29/2019  PCP: System, Pcp Not In   Patient coming from: Home  I have personally briefly reviewed patient's old medical records in [facility]  Chief Complaint: Left leg pain  HPI: Kelli Morgan is a 74 y.o. female, recently relocated from [place] with medical history significant for peripheral vascular disease status post bilateral lower extremity angioplasty in February 2021, OSA on CPAP, history of DVT, COPD and A. fib status post ablation x2, on chronic anticoagulation with Eliquis who presents to the emergency room with pain left leg, with ambulation bluish discoloration to the leg.  The pain is sharp, severe, worse at nights and with ambulation or standing for long periods of time.  She denies chest pain or shortness of breath.  Reports that she has not been compliant with her Eliquis on account of the recent move.  She has no swelling of the leg ED Course: On arrival, blood pressure 185/86 with otherwise normal vitals.  BMP and CBC unremarkable.  Chest x-ray clear.  EKG normal sinus rhythm with no acute ST-T wave changes.  Lower extremity venous Doppler pending at the time of admission.  The emergency room provider spoke with vascular surgeon, Dr. Marena Chancy who recommended heparin infusion and keeping patient n.p.o. for angiogram in the a.m. hospitalist consulted for admission.  Review of Systems: As per HPI otherwise all other systems on review of systems negative.    Past Medical History:  Diagnosis Date   A-fib [facility])    COPD (chronic obstructive pulmonary disease) (HCC)    DVT (deep venous thrombosis) (HCC)    Hypertension     Past Surgical History:  Procedure Laterality Date   LOWER EXTREMITY ANGIOGRAM       reports that she has quit smoking. She has never used smokeless tobacco. She reports current alcohol use. No history on file for drug use.  Allergies  Allergen Reactions    Penicillins     History reviewed. No pertinent family history.    Prior to Admission medications   Not on File    Physical Exam: Vitals:   07/29/19 0031 07/29/19 0032  BP: (!) 185/86   Pulse: 75   Resp: 16   Temp: 98.5 F (36.9 C)   TempSrc: Oral   SpO2: 97%   Weight:  90.7 kg  Height:  5\' 5"  (1.651 m)     Vitals:   07/29/19 0031 07/29/19 0032  BP: (!) 185/86   Pulse: 75   Resp: 16   Temp: 98.5 F (36.9 C)   TempSrc: Oral   SpO2: 97%   Weight:  90.7 kg  Height:  5\' 5"  (1.651 m)      Constitutional: Alert and oriented x 3 . Not in any apparent distress HEENT:      Head: Normocephalic and atraumatic.         Eyes: PERLA, EOMI, Conjunctivae are normal. Sclera is non-icteric.       Mouth/Throat: Mucous membranes are moist.       Neck: Supple with no signs of meningismus. Cardiovascular: Regular rate and rhythm. No murmurs, gallops, or rubs.No palpable PT, DP, or popliteal pulses on the LLE.  Unable to pick up set pulses on Doppler.  Patient has palpable L femoral pulse.  Capillary refill 4 to 5 seconds.  Pallor with bluish tinge of the foot.  Not cold.  No edema  respiratory: Respiratory effort normal .Lungs sounds clear bilaterally. No  wheezes, crackles, or rhonchi.  Gastrointestinal: Soft, non tender, and non distended with positive bowel sounds. No rebound or guarding. Genitourinary: No CVA tenderness. Musculoskeletal: Nontender with normal range of motion in all extremities.No palpable PT, DP, or popliteal pulses on the LLE.  Unable to pick up set pulses on Doppler.  Patient has palpable L femoral pulse.  Capillary refill 4 to 5 seconds.  Pallor with bluish tinge of the foot.  Not cold.  No edema Neurologic: Normal speech and language. Face is symmetric. Moving all extremities. No gross focal neurologic deficits . Skin: Skin is warm, dry.  No rash or ulcers Psychiatric: Mood and affect are normal Speech and behavior are normal   Labs on Admission: I have  personally reviewed following labs and imaging studies  CBC: Recent Labs  Lab 07/29/19 0043  WBC 7.5  HGB 12.0  HCT 38.2  MCV 81.3  PLT 214   Basic Metabolic Panel: Recent Labs  Lab 07/29/19 0043  NA 141  K 4.4  CL 104  CO2 26  GLUCOSE 99  BUN 23  CREATININE 0.75  CALCIUM 9.6   GFR: Estimated Creatinine Clearance: 67.6 mL/min (by C-G formula based on SCr of 0.75 mg/dL). Liver Function Tests: No results for input(s): AST, ALT, ALKPHOS, BILITOT, PROT, ALBUMIN in the last 168 hours. No results for input(s): LIPASE, AMYLASE in the last 168 hours. No results for input(s): AMMONIA in the last 168 hours. Coagulation Profile: No results for input(s): INR, PROTIME in the last 168 hours. Cardiac Enzymes: No results for input(s): CKTOTAL, CKMB, CKMBINDEX, TROPONINI in the last 168 hours. BNP (last 3 results) No results for input(s): PROBNP in the last 8760 hours. HbA1C: No results for input(s): HGBA1C in the last 72 hours. CBG: No results for input(s): GLUCAP in the last 168 hours. Lipid Profile: No results for input(s): CHOL, HDL, LDLCALC, TRIG, CHOLHDL, LDLDIRECT in the last 72 hours. Thyroid Function Tests: No results for input(s): TSH, T4TOTAL, FREET4, T3FREE, THYROIDAB in the last 72 hours. Anemia Panel: No results for input(s): VITAMINB12, FOLATE, FERRITIN, TIBC, IRON, RETICCTPCT in the last 72 hours. Urine analysis: No results found for: COLORURINE, APPEARANCEUR, LABSPEC, PHURINE, GLUCOSEU, HGBUR, BILIRUBINUR, KETONESUR, PROTEINUR, UROBILINOGEN, NITRITE, LEUKOCYTESUR  Radiological Exams on Admission: DG Chest 2 View  Result Date: 07/29/2019 CLINICAL DATA:  Left leg pain EXAM: CHEST - 2 VIEW COMPARISON:  None. FINDINGS: The heart size and mediastinal contours are within normal limits. Both lungs are clear. The visualized skeletal structures are unremarkable. IMPRESSION: No active cardiopulmonary disease. Electronically Signed   By: Alcide Clever M.D.   On: 07/29/2019  01:02    EKG: Independently reviewed. Interpretation : Normal sinus rhythm with no acute ST-T wave changes Assessment/Plan  74 year old female recently relocated from [place] with history of PVD with angioplasty bilateral lower extremity in February as well as history of A. fib, COPD and DVT, only partially compliant with Eliquis presenting with left leg pain with absent pulses on exam    Ischemic leg pain   PVD (peripheral vascular disease) (HCC) -Heparin infusion -Vascular consulted, Dr. Marena Chancy: -N.p.o. for angiogram later in the a.m.    COPD with chronic bronchitis (HCC) -Not acutely exacerbated.  DuoNebs as needed    Atrial fibrillation, suspect paroxysmal -Currently in sinus rhythm.  History of ablation x2 -Currently on heparin infusion.(Takes Eliquis)  OSA on CPAP -CPAP as desired.  Can use home unit   DVT prophylaxis: Heparin infusion  code Status: full code  Family Communication: Husband at bedside  Disposition Plan: Back to previous home environment Consults called: Vascular Status:obs    Andris Baumann MD [facility]     07/29/2019, 5:47 AM

## 2019-07-29 NOTE — ED Notes (Signed)
Pt requesting a purewick be placed to urinate freely, pt's clothing removed and placed in a belongings bag and given to her husband, a purwick was applied as well as a chux underneath the pt, pt tol well and was given an additional warm blanket. No distress noted, cont to monitor

## 2019-07-29 NOTE — Progress Notes (Signed)
ANTICOAGULATION CONSULT NOTE - Initial Consult  Pharmacy Consult for Tirofiban (aggrastat) Indication: periph intervention. Percutaneous transluminal angioplasty and stent placement left superficial femoral artery and popliteal  Allergies  Allergen Reactions   Penicillins Anaphylaxis   Tape Rash and Other (See Comments)    Band-Aid, medical tape, etc. / Severe rash, open wounds     Patient Measurements: Height: 5\' 5"  (165.1 cm) Weight: 90.7 kg (200 lb) IBW/kg (Calculated) : 57 Heparin Dosing Weight:    Vital Signs: BP: 154/70 (07/09 1515) Pulse Rate: 86 (07/09 1515)  Labs: Recent Labs    07/29/19 0043 07/29/19 0532  HGB 12.0  --   HCT 38.2  --   PLT 214  --   APTT  --  29  LABPROT  --  13.3  INR  --  1.1  CREATININE 0.75  --     Estimated Creatinine Clearance: 67.6 mL/min (by C-G formula based on SCr of 0.75 mg/dL).   Medical History: Past Medical History:  Diagnosis Date   A-fib (HCC)    COPD (chronic obstructive pulmonary disease) (HCC)    DVT (deep venous thrombosis) (HCC)    Hypertension      Assessment: 74 yo F with specials intervention for Atherosclerotic occlusive disease. To start Aggrastat  Goal of Therapy:  Monitor platelets by anticoagulation protocol: Yes   Plan:  For Crcl > 60 ml/min:   Tirofiban bolus 2257 mg x 1 ( 25 mcg/kg  90.7 kg) and continuous infusion of 0.15 mcg/kg/min  90.7 kg= 16.33 ml/hr) x 18 hrs.  hgb 12.0  Plt 214  Jerica Creegan A 07/29/2019,3:20 PM

## 2019-07-29 NOTE — Progress Notes (Signed)
She feels better.  Pain in left leg is better.  Continue IV heparin infusion and monitor heparin level per protocol.  Plan for angiogram today.  Plan discussed with her husband at the bedside.      LOS: 0 days   Kelli Morgan  [facility]     07/29/2019, 9:44 AM

## 2019-07-29 NOTE — Progress Notes (Signed)
ANTICOAGULATION CONSULT NOTE - Initial Consult  Pharmacy Consult for Heparin Indication: lower extremity arterial blockage  Allergies  Allergen Reactions  . Penicillins    Patient Measurements: Height: 5\' 5"  (165.1 cm) Weight: 90.7 kg (200 lb) IBW/kg (Calculated) : 57 HEPARIN DW (KG): 77.1  Vital Signs: Temp: 98.5 F (36.9 C) (07/09 0031) Temp Source: Oral (07/09 0031) BP: 185/86 (07/09 0031) Pulse Rate: 75 (07/09 0031)  Labs: Recent Labs    07/29/19 0043  HGB 12.0  HCT 38.2  PLT 214  CREATININE 0.75    Estimated Creatinine Clearance: 67.6 mL/min (by C-G formula based on SCr of 0.75 mg/dL).  Medical History: Past Medical History:  Diagnosis Date  . A-fib (HCC)   . COPD (chronic obstructive pulmonary disease) (HCC)   . DVT (deep venous thrombosis) (HCC)   . Hypertension     Medications:  (Not in a hospital admission)   Assessment: Pt reportedly on Eliquis for chronically occurring DVTs.  Reportedly has not taken Eliquis today.  Baseline labs pending.  Goal of Therapy:  aPTT 66-102 seconds Monitor platelets by anticoagulation protocol: Yes   Plan:  Heparin 4000 units x 1 bolus then infusion at 1100 units/hr Check aPTT 8 hours after heparin started  09/29/19 A 07/29/2019,5:34 AM

## 2019-07-30 DIAGNOSIS — I482 Chronic atrial fibrillation, unspecified: Secondary | ICD-10-CM | POA: Diagnosis not present

## 2019-07-30 DIAGNOSIS — Z9989 Dependence on other enabling machines and devices: Secondary | ICD-10-CM

## 2019-07-30 DIAGNOSIS — I998 Other disorder of circulatory system: Secondary | ICD-10-CM | POA: Diagnosis not present

## 2019-07-30 DIAGNOSIS — M79606 Pain in leg, unspecified: Secondary | ICD-10-CM | POA: Diagnosis not present

## 2019-07-30 DIAGNOSIS — G4733 Obstructive sleep apnea (adult) (pediatric): Secondary | ICD-10-CM

## 2019-07-30 DIAGNOSIS — I739 Peripheral vascular disease, unspecified: Secondary | ICD-10-CM | POA: Diagnosis not present

## 2019-07-30 MED ORDER — CLOPIDOGREL BISULFATE 75 MG PO TABS: 75.0000 mg | ORAL_TABLET | Freq: Every day | ORAL | 0 refills | Status: AC

## 2019-07-30 MED ORDER — ACETAMINOPHEN 325 MG PO TABS: 650.0000 mg | ORAL_TABLET | Freq: Four times a day (QID) | ORAL | Status: DC | PRN

## 2019-07-30 MED ORDER — DONEPEZIL HCL 10 MG PO TABS: 10.0000 mg | ORAL_TABLET | Freq: Every day | ORAL | 0 refills | Status: AC

## 2019-07-30 MED ORDER — APIXABAN 5 MG PO TABS: 5.0000 mg | ORAL_TABLET | Freq: Two times a day (BID) | ORAL | Status: DC

## 2019-07-30 NOTE — Discharge Summary (Signed)
Physician Discharge Summary  Kelli Morgan [id number]:[id number] DOB: 13-Sep-1945 DOA: 07/29/2019  PCP: System, Pcp Not In  Admit date: 07/29/2019 Discharge date: 07/30/2019  Discharge disposition: Home with home health therapy   Recommendations for Outpatient Follow-Up:   Outpatient follow-up with PCP Outpatient follow-up with vascular surgeon   Discharge Diagnosis:   Active Problems:   Ischemic leg pain   PVD (peripheral vascular disease) (HCC)   COPD with chronic bronchitis (HCC)   Atrial fibrillation, chronic (HCC)   OSA on CPAP    Discharge Condition: Stable.  Diet recommendation:  Diet Order            Diet - low sodium heart healthy           Diet Heart Room service appropriate? Yes; Fluid consistency: Thin  Diet effective now                   Code Status: Full Code     Hospital Course:   [name] is a 74 y.o. female, recently relocated from [place] with medical history significant for peripheral vascular disease status post bilateral lower extremity angioplasty in February 2021, OSA on CPAP, history of DVT, COPD and A. fib status post ablation x2, on chronic anticoagulation with Eliquis.  She presented to the hospital with severe pain in the left leg associated with black discoloration of the leg.    She was admitted to the hospital for ischemic left leg.  She was treated with IV heparin infusion.  She was seen in consultation by the vascular surgeon.  Percutaneous transluminal angioplasty and stent placement to the left superficial femoral and popliteal arteries was performed on 07/29/2019.  Her symptoms have resolved.  Dr. Consuello Closs, vascular surgeon,, recommended treatment with Plavix in addition to continuation of her home Eliquis.  Her condition has improved and she is deemed stable for discharge to home.  Discharge plan was discussed with Dr. Consuello Closs and from his standpoint, patient is okay for discharge.  Discharge plan was discussed with the  patient and her husband at the bedside.     Discharge Exam:   Vitals:   07/30/19 1131 07/30/19 1534  BP: (!) 171/71 (!) 168/83  Pulse: 90 89  Resp: 18 19  Temp: 98.6 F (37 C) 98.2 F (36.8 C)  SpO2: 96% 93%   Vitals:   07/30/19 0504 07/30/19 0858 07/30/19 1131 07/30/19 1534  BP:  (!) 157/116 (!) 171/71 (!) 168/83  Pulse:  93 90 89  Resp:  18 18 19   Temp:  98.5 F (36.9 C) 98.6 F (37 C) 98.2 F (36.8 C)  TempSrc:  Oral    SpO2:  95% 96% 93%  Weight: 92.1 kg     Height:         GEN: NAD SKIN: No rash EYES: EOMI ENT: MMM CV: RRR PULM: CTA B ABD: soft, obese, NT, +BS CNS: AAO x 3, non focal EXT: No edema or tenderness   The results of significant diagnostics from this hospitalization (including imaging, microbiology, ancillary and laboratory) are listed below for reference.     Procedures and Diagnostic Studies:   DG Chest 2 View  Result Date: 07/29/2019 CLINICAL DATA:  Left leg pain EXAM: CHEST - 2 VIEW COMPARISON:  None. FINDINGS: The heart size and mediastinal contours are within normal limits. Both lungs are clear. The visualized skeletal structures are unremarkable. IMPRESSION: No active cardiopulmonary disease. Electronically Signed   By: 09/29/2019 M.D.   On:  07/29/2019 01:02   PERIPHERAL VASCULAR CATHETERIZATION  Result Date: 07/29/2019 See Op Note  US Venous Img Lower Unilateral Left  Result Date: 07/29/2019 CLINICAL DATA:  Left leg pain. EXAM: LEFT LOWER EXTREMITY VENOUS DOPPLER ULTRASOUND TECHNIQUE: Gray-scale sonography with compression, as well as color and duplex ultrasound, were performed to evaluate the deep venous system(s) from the level of the common femoral vein through the popliteal and proximal calf veins. COMPARISON:  None. FINDINGS: VENOUS Normal compressibility of the common femoral, superficial femoral, and popliteal veins, as well as the visualized calf veins. Visualized portions of profunda femoral vein and great saphenous vein  unremarkable. No filling defects to suggest DVT on grayscale or color Doppler imaging. Doppler waveforms show normal direction of venous flow, normal respiratory plasticity and response to augmentation. Limited views of the contralateral common femoral vein are unremarkable. OTHER None. IMPRESSION: Negative. Electronically Signed   By: Maisie Fus  Register   On: 07/29/2019 06:45     Labs:   Basic Metabolic Panel: Recent Labs  Lab 07/29/19 0043  NA 141  K 4.4  CL 104  CO2 26  GLUCOSE 99  BUN 23  CREATININE 0.75  CALCIUM 9.6   GFR Estimated Creatinine Clearance: 68.1 mL/min (by C-G formula based on SCr of 0.75 mg/dL). Liver Function Tests: No results for input(s): AST, ALT, ALKPHOS, BILITOT, PROT, ALBUMIN in the last 168 hours. No results for input(s): LIPASE, AMYLASE in the last 168 hours. No results for input(s): AMMONIA in the last 168 hours. Coagulation profile Recent Labs  Lab 07/29/19 0532  INR 1.1    CBC: Recent Labs  Lab 07/29/19 0043  WBC 7.5  HGB 12.0  HCT 38.2  MCV 81.3  PLT 214   Cardiac Enzymes: No results for input(s): CKTOTAL, CKMB, CKMBINDEX, TROPONINI in the last 168 hours. BNP: Invalid input(s): POCBNP CBG: No results for input(s): GLUCAP in the last 168 hours. D-Dimer No results for input(s): DDIMER in the last 72 hours. Hgb A1c No results for input(s): HGBA1C in the last 72 hours. Lipid Profile No results for input(s): CHOL, HDL, LDLCALC, TRIG, CHOLHDL, LDLDIRECT in the last 72 hours. Thyroid function studies No results for input(s): TSH, T4TOTAL, T3FREE, THYROIDAB in the last 72 hours.  Invalid input(s): FREET3 Anemia work up No results for input(s): VITAMINB12, FOLATE, FERRITIN, TIBC, IRON, RETICCTPCT in the last 72 hours. Microbiology Recent Results (from the past 240 hour(s))  SARS Coronavirus 2 by RT PCR (hospital order, performed in [facility] hospital lab) Nasopharyngeal Nasopharyngeal Swab     Status: None   Collection Time:  07/29/19  5:32 AM   Specimen: Nasopharyngeal Swab  Result Value Ref Range Status   SARS Coronavirus 2 NEGATIVE NEGATIVE Final    Comment: (NOTE) SARS-CoV-2 target nucleic acids are NOT DETECTED.  The SARS-CoV-2 RNA is generally detectable in upper and lower respiratory specimens during the acute phase of infection. The lowest concentration of SARS-CoV-2 viral copies this assay can detect is 250 copies / mL. A negative result does not preclude SARS-CoV-2 infection and should not be used as the sole basis for treatment or other patient management decisions.  A negative result may occur with improper specimen collection / handling, submission of specimen other than nasopharyngeal swab, presence of viral mutation(s) within the areas targeted by this assay, and inadequate number of viral copies (<250 copies / mL). A negative result must be combined with clinical observations, patient history, and epidemiological information.  Fact Sheet for Patients:   BoilerBrush.com.cy  Fact Sheet  for Healthcare Providers: https://pope.com/  This test is not yet approved or  cleared by the [place] and has been authorized for detection and/or diagnosis of SARS-CoV-2 by FDA under an Emergency Use Authorization (EUA).  This EUA will remain in effect (meaning this test can be used) for the duration of the COVID-19 declaration under Section 564(b)(1) of the Act, 21 U.S.C. section 360bbb-3(b)(1), unless the authorization is terminated or revoked sooner.  Performed at [facility], [address] [place], [place] [address]      Discharge Instructions:   Discharge Instructions    Diet - low sodium heart healthy   Complete by: As directed    Face-to-face encounter (required for Medicare/Medicaid patients)   Complete by: As directed    I Vendela Troung certify that this patient is under my care and that I, or a nurse practitioner or  physician's assistant working with me, had a face-to-face encounter that meets the physician face-to-face encounter requirements with this patient on 07/30/2019. The encounter with the patient was in whole, or in part for the following medical condition(s) which is the primary reason for home health care (List medical condition): Generalized weakness   The encounter with the patient was in whole, or in part, for the following medical condition, which is the primary reason for home health care: generalized weakness   I certify that, based on my findings, the following services are medically necessary home health services: Physical therapy   Reason for Medically Necessary Home Health Services: Therapy- Investment banker, operational, Teacher, early years/pre and Stair Training   My clinical findings support the need for the above services: Unsafe ambulation due to balance issues   Further, I certify that my clinical findings support that this patient is homebound due to: Unsafe ambulation due to balance issues   Home Health   Complete by: As directed    To provide the following care/treatments: PT   Increase activity slowly   Complete by: As directed      Allergies as of 07/30/2019      Reactions   Penicillins Anaphylaxis   Tape Rash, Other (See Comments)   Band-Aid, medical tape, etc. / Severe rash, open wounds       Medication List    STOP taking these medications   ibuprofen 200 MG tablet Commonly known as: ADVIL     TAKE these medications   acetaminophen 325 MG tablet Commonly known as: TYLENOL Take 2 tablets (650 mg total) by mouth every 6 (six) hours as needed for mild pain or headache.   albuterol 108 (90 Base) MCG/ACT inhaler Commonly known as: VENTOLIN HFA Inhale 1-2 puffs into the lungs every 6 (six) hours as needed for wheezing or shortness of breath.   clopidogrel 75 MG tablet Commonly known as: PLAVIX Take 1 tablet (75 mg total) by mouth daily with breakfast. Start taking on: July 31, 2019    donepezil 10 MG tablet Commonly known as: ARICEPT Take 1 tablet (10 mg total) by mouth daily.   Eliquis 5 MG Tabs tablet Generic drug: apixaban Take 5 mg by mouth 2 (two) times daily.   LATANOPROST OP Place 1 drop into both eyes daily.   losartan 100 MG tablet Commonly known as: COZAAR Take 100 mg by mouth daily.   Vitamin D (Ergocalciferol) 1.25 MG (50000 UNIT) Caps capsule Commonly known as: DRISDOL Take 50,000 Units by mouth every 7 (seven) days.       Follow-up Information    Schnier, Latina Craver,  MD. Schedule an appointment as soon as possible for a visit in 2 week(s).   Specialties: Vascular Surgery, Cardiology, Radiology, Vascular Surgery Contact information: [address] [place] [address] [phone]                Time coordinating discharge: 40 minutes  Signed:  Lurene ShadowBERNARD Cecelia Graciano  [facility] 07/30/2019, 5:39 PM

## 2019-07-30 NOTE — Care Management Obs Status (Signed)
MEDICARE OBSERVATION STATUS NOTIFICATION   Patient Details  Name: Kelli Morgan MRN: [id number] Date of Birth: 06/20/1945   Medicare Observation Status Notification Given:  Yes    Eilleen Kempf, LCSW 07/30/2019, 12:56 PM

## 2019-07-30 NOTE — Progress Notes (Signed)
Progress Note    Kelli Morgan  [id number]:[id number] DOB: 07-10-1945  DOA: 07/29/2019 PCP: System, Pcp Not In      Brief Narrative:    Medical records reviewed and are as summarized below:  Kelli Morgan is a 74 y.o. female       Assessment/Plan:   Active Problems:   Ischemic leg pain   PVD (peripheral vascular disease) (HCC)   COPD with chronic bronchitis (HCC)   Atrial fibrillation, chronic (HCC)   OSA on CPAP   PVD with acute ischemic leg pain: s/p percutaneous transluminal angioplasty and stent placement of left superficial femoral and popliteal arteries.  She has been started on Plavix by vascular surgeon.  Continue Eliquis.  Plan of care was discussed with Dr. Consuello Closs, who recommended Plavix and Eliquis at discharge.  Generalized weakness: Ambulate with assistance.  COPD: Compensated.  Continue bronchodilators as needed  Paroxysmal atrial fibrillation: Continue Eliquis.  OSA on CPAP: CPAP at night.  Body mass index is 33.78 kg/m.  (Obesity)  Diet Order            Diet Heart Room service appropriate? Yes; Fluid consistency: Thin  Diet effective now                       Medications:   . clopidogrel  75 mg Oral Q breakfast  . donepezil  10 mg Oral Daily  . latanoprost  1 drop Both Eyes Daily  . losartan  100 mg Oral Daily  . sodium chloride flush  3 mL Intravenous Once  . sodium chloride flush  3 mL Intravenous Q12H  . [START ON 08/01/2019] Vitamin D (Ergocalciferol)  50,000 Units Oral Q7 days   Continuous Infusions: . sodium chloride       Anti-infectives (From admission, onward)   Start     Dose/Rate Route Frequency Ordered Stop   07/30/19 0000  clindamycin (CLEOCIN) IVPB 300 mg       Note to Pharmacy: To be given in specials   300 mg 100 mL/hr over 30 Minutes Intravenous  Once 07/29/19 1148 07/29/19 1333             Family Communication/Anticipated D/C date and plan/Code Status   DVT prophylaxis:      Code  Status: Full Code  Family Communication: Plan discussed with patient Disposition Plan:    Status is: Observation  The patient will require care spanning > 2 midnights and should be moved to inpatient because: Inpatient level of care appropriate due to severity of illness  Dispo: The patient is from: Home              Anticipated d/c is to: Home              Anticipated d/c date is: 1 day              Patient currently is not medically stable to d/c.           Subjective:   Pain in left leg has improved.  She complains of generalized weakness and she feels that she cannot go home today.  Objective:    Vitals:   07/29/19 2130 07/30/19 0504 07/30/19 0858 07/30/19 1131  BP:   (!) 157/116 (!) 171/71  Pulse:   93 90  Resp:   18 18  Temp:   98.5 F (36.9 C) 98.6 F (37 C)  TempSrc:   Oral   SpO2: 100%  95% 96%  Weight:  92.1 kg    Height:       No data found.   Intake/Output Summary (Last 24 hours) at 07/30/2019 1513 Last data filed at 07/30/2019 1500 Gross per 24 hour  Intake 660.74 ml  Output 300 ml  Net 360.74 ml   Filed Weights   07/29/19 0032 07/29/19 1709 07/30/19 0504  Weight: 90.7 kg 89.2 kg 92.1 kg    Exam:  GEN: NAD SKIN: No rash EYES: EOMI ENT: MMM CV: RRR PULM: CTA B ABD: soft, obese, NT, +BS CNS: AAO x 3, non focal EXT: No edema or tenderness. Warm extremities   Data Reviewed:   I have personally reviewed following labs and imaging studies:  Labs: Labs show the following:   Basic Metabolic Panel: Recent Labs  Lab 07/29/19 0043  NA 141  K 4.4  CL 104  CO2 26  GLUCOSE 99  BUN 23  CREATININE 0.75  CALCIUM 9.6   GFR Estimated Creatinine Clearance: 68.1 mL/min (by C-G formula based on SCr of 0.75 mg/dL). Liver Function Tests: No results for input(s): AST, ALT, ALKPHOS, BILITOT, PROT, ALBUMIN in the last 168 hours. No results for input(s): LIPASE, AMYLASE in the last 168 hours. No results for input(s): AMMONIA in the last  168 hours. Coagulation profile Recent Labs  Lab 07/29/19 0532  INR 1.1    CBC: Recent Labs  Lab 07/29/19 0043  WBC 7.5  HGB 12.0  HCT 38.2  MCV 81.3  PLT 214   Cardiac Enzymes: No results for input(s): CKTOTAL, CKMB, CKMBINDEX, TROPONINI in the last 168 hours. BNP (last 3 results) No results for input(s): PROBNP in the last 8760 hours. CBG: No results for input(s): GLUCAP in the last 168 hours. D-Dimer: No results for input(s): DDIMER in the last 72 hours. Hgb A1c: No results for input(s): HGBA1C in the last 72 hours. Lipid Profile: No results for input(s): CHOL, HDL, LDLCALC, TRIG, CHOLHDL, LDLDIRECT in the last 72 hours. Thyroid function studies: No results for input(s): TSH, T4TOTAL, T3FREE, THYROIDAB in the last 72 hours.  Invalid input(s): FREET3 Anemia work up: No results for input(s): VITAMINB12, FOLATE, FERRITIN, TIBC, IRON, RETICCTPCT in the last 72 hours. Sepsis Labs: Recent Labs  Lab 07/29/19 0043  WBC 7.5    Microbiology Recent Results (from the past 240 hour(s))  SARS Coronavirus 2 by RT PCR (hospital order, performed in [facility] hospital lab) Nasopharyngeal Nasopharyngeal Swab     Status: None   Collection Time: 07/29/19  5:32 AM   Specimen: Nasopharyngeal Swab  Result Value Ref Range Status   SARS Coronavirus 2 NEGATIVE NEGATIVE Final    Comment: (NOTE) SARS-CoV-2 target nucleic acids are NOT DETECTED.  The SARS-CoV-2 RNA is generally detectable in upper and lower respiratory specimens during the acute phase of infection. The lowest concentration of SARS-CoV-2 viral copies this assay can detect is 250 copies / mL. A negative result does not preclude SARS-CoV-2 infection and should not be used as the sole basis for treatment or other patient management decisions.  A negative result may occur with improper specimen collection / handling, submission of specimen other than nasopharyngeal swab, presence of viral mutation(s) within the areas  targeted by this assay, and inadequate number of viral copies (<250 copies / mL). A negative result must be combined with clinical observations, patient history, and epidemiological information.  Fact Sheet for Patients:   BoilerBrush.com.cy  Fact Sheet for Healthcare Providers: https://pope.com/  This test is not yet approved or  cleared by  the [employer] and has been authorized for detection and/or diagnosis of SARS-CoV-2 by FDA under an Emergency Use Authorization (EUA).  This EUA will remain in effect (meaning this test can be used) for the duration of the COVID-19 declaration under Section 564(b)(1) of the Act, 21 U.S.C. section 360bbb-3(b)(1), unless the authorization is terminated or revoked sooner.  Performed at [facility], [address] [place], [place] [address]     Procedures and diagnostic studies:  DG Chest 2 View  Result Date: 07/29/2019 CLINICAL DATA:  Left leg pain EXAM: CHEST - 2 VIEW COMPARISON:  None. FINDINGS: The heart size and mediastinal contours are within normal limits. Both lungs are clear. The visualized skeletal structures are unremarkable. IMPRESSION: No active cardiopulmonary disease. Electronically Signed   By: Alcide Clever M.D.   On: 07/29/2019 01:02   PERIPHERAL VASCULAR CATHETERIZATION  Result Date: 07/29/2019 See Op Note  US Venous Img Lower Unilateral Left  Result Date: 07/29/2019 CLINICAL DATA:  Left leg pain. EXAM: LEFT LOWER EXTREMITY VENOUS DOPPLER ULTRASOUND TECHNIQUE: Gray-scale sonography with compression, as well as color and duplex ultrasound, were performed to evaluate the deep venous system(s) from the level of the common femoral vein through the popliteal and proximal calf veins. COMPARISON:  None. FINDINGS: VENOUS Normal compressibility of the common femoral, superficial femoral, and popliteal veins, as well as the visualized calf veins. Visualized portions of profunda  femoral vein and great saphenous vein unremarkable. No filling defects to suggest DVT on grayscale or color Doppler imaging. Doppler waveforms show normal direction of venous flow, normal respiratory plasticity and response to augmentation. Limited views of the contralateral common femoral vein are unremarkable. OTHER None. IMPRESSION: Negative. Electronically Signed   By: Maisie Fus  Register   On: 07/29/2019 06:45               LOS: 0 days   Sharmarke Cicio  [facility]     07/30/2019, 3:13 PM

## 2019-07-30 NOTE — Progress Notes (Signed)
Vascular  Progress Note   Date of Surgery: 07/29/2019 Procedures:  Procedure(s): Lower Extremity Angiography Surgeon: Surgeon(s): Schnier, Latina Craver, MD  1 Day Post-Op  History of Present Illness  Kelli Morgan is a 74 y.o. female who is 1 Day Post-Op. The patient's pre-op symptoms of left foot pain are Improved . Patients pain is well controlled.   She continues with numbness in her foot    Significant Diagnostic Studies: CBC Lab Results  Component Value Date   WBC 7.5 07/29/2019   HGB 12.0 07/29/2019   HCT 38.2 07/29/2019   MCV 81.3 07/29/2019   PLT 214 07/29/2019    BMET    Component Value Date/Time   NA 141 07/29/2019 0043   K 4.4 07/29/2019 0043   CL 104 07/29/2019 0043   CO2 26 07/29/2019 0043   GLUCOSE 99 07/29/2019 0043   BUN 23 07/29/2019 0043   CREATININE 0.75 07/29/2019 0043   CALCIUM 9.6 07/29/2019 0043   GFRNONAA >60 07/29/2019 0043   GFRAA >60 07/29/2019 0043    COAG Lab Results  Component Value Date   INR 1.1 07/29/2019   No results found for: PTT  Physical Examination  BP Readings from Last 3 Encounters:  07/30/19 (!) 157/116   Temp Readings from Last 3 Encounters:  07/30/19 98.5 F (36.9 C) (Oral)   @[email or web address] (3)@ Pulse Readings from Last 3 Encounters:  07/30/19 93    Pt is A&O x 3 Left foot is warm. Biphasic DP and PT. Refill 3 sec   Assessment/Plan: Pt. Doing well Remove femoral stop Start ambulation eliquis 09/30/19, MD  07/30/2019 11:13 AM

## 2019-08-01 ENCOUNTER — Encounter: Payer: Self-pay | Admitting: Vascular Surgery

## 2019-08-02 ENCOUNTER — Telehealth (INDEPENDENT_AMBULATORY_CARE_PROVIDER_SITE_OTHER): Payer: Self-pay

## 2019-08-02 NOTE — Telephone Encounter (Signed)
Redness and swelling is common post angiogram due to a "rush" of blood flow to the area.  This causes the area to become warm and swollen.  This can last up to 2 weeks.  She should use compression stockings and elevate her lower extremity to help with this.  The patient had numbness due to lack of blood flow PRIOR to her intervention and this can persist for sometime and in some cases become permanent. She should try the conservative tactics mentioned and we can move up her appointment with an ABI

## 2019-08-02 NOTE — Telephone Encounter (Signed)
Patient was made aware with medical advice and verbalized understanding 

## 2019-08-08 ENCOUNTER — Other Ambulatory Visit (INDEPENDENT_AMBULATORY_CARE_PROVIDER_SITE_OTHER): Payer: Self-pay | Admitting: Vascular Surgery

## 2019-08-08 DIAGNOSIS — I70222 Atherosclerosis of native arteries of extremities with rest pain, left leg: Secondary | ICD-10-CM

## 2019-08-08 DIAGNOSIS — Z95820 Peripheral vascular angioplasty status with implants and grafts: Secondary | ICD-10-CM

## 2019-08-09 ENCOUNTER — Ambulatory Visit (INDEPENDENT_AMBULATORY_CARE_PROVIDER_SITE_OTHER): Payer: Medicare Other | Admitting: Nurse Practitioner

## 2019-08-09 ENCOUNTER — Ambulatory Visit (INDEPENDENT_AMBULATORY_CARE_PROVIDER_SITE_OTHER): Payer: Medicare Other

## 2019-08-09 ENCOUNTER — Encounter (INDEPENDENT_AMBULATORY_CARE_PROVIDER_SITE_OTHER): Payer: Self-pay | Admitting: Nurse Practitioner

## 2019-08-09 ENCOUNTER — Other Ambulatory Visit: Payer: Self-pay

## 2019-08-09 VITALS — BP 150/77 | HR 75 | Resp 16 | Wt 201.0 lb

## 2019-08-09 DIAGNOSIS — I482 Chronic atrial fibrillation, unspecified: Secondary | ICD-10-CM | POA: Diagnosis not present

## 2019-08-09 DIAGNOSIS — I70222 Atherosclerosis of native arteries of extremities with rest pain, left leg: Secondary | ICD-10-CM | POA: Diagnosis not present

## 2019-08-09 DIAGNOSIS — Z95820 Peripheral vascular angioplasty status with implants and grafts: Secondary | ICD-10-CM

## 2019-08-09 DIAGNOSIS — M7989 Other specified soft tissue disorders: Secondary | ICD-10-CM | POA: Diagnosis not present

## 2019-08-09 DIAGNOSIS — I739 Peripheral vascular disease, unspecified: Secondary | ICD-10-CM

## 2019-08-09 DIAGNOSIS — J449 Chronic obstructive pulmonary disease, unspecified: Secondary | ICD-10-CM

## 2019-08-09 DIAGNOSIS — J4489 Other specified chronic obstructive pulmonary disease: Secondary | ICD-10-CM

## 2019-08-12 ENCOUNTER — Other Ambulatory Visit: Payer: Self-pay

## 2019-08-12 ENCOUNTER — Encounter (INDEPENDENT_AMBULATORY_CARE_PROVIDER_SITE_OTHER): Payer: Self-pay | Admitting: Nurse Practitioner

## 2019-08-12 ENCOUNTER — Ambulatory Visit (INDEPENDENT_AMBULATORY_CARE_PROVIDER_SITE_OTHER): Payer: Medicare Other | Admitting: Nurse Practitioner

## 2019-08-12 ENCOUNTER — Ambulatory Visit (INDEPENDENT_AMBULATORY_CARE_PROVIDER_SITE_OTHER): Payer: Self-pay | Admitting: Nurse Practitioner

## 2019-08-12 VITALS — BP 168/78 | HR 74 | Resp 16 | Wt 202.0 lb

## 2019-08-12 DIAGNOSIS — R600 Localized edema: Secondary | ICD-10-CM

## 2019-08-12 DIAGNOSIS — M7989 Other specified soft tissue disorders: Secondary | ICD-10-CM

## 2019-08-12 NOTE — Progress Notes (Signed)
Subjective:    Patient ID: Kelli Morgan, female    DOB: 01/24/1945, 74 y.o.   MRN: [id number] Chief Complaint  Patient presents with  . Follow-up    The patient returns to the office for followup and review status post angiogram with intervention. The patient notes improvement in the lower extremity symptoms. No interval shortening of the patient's claudication distance or rest pain symptoms. Previous wounds have now healed.  No new ulcers or wounds have occurred since the last visit.  The patient presented to the [facility] with an ischemic leg.  The patient notes that she previously had intervention done in [place].  However, the patient's husband and is not sure that intervention was actually done.  So there is some question as to whether this was a rethrombosed stent or sudden ischemia.  The patient underwent intervention including:  Procedure(s) Performed: 1. Introduction catheter into left lower extremity 3rd order catheter placement  2. Contrast injection left lower extremity for distal runoff   3. Percutaneous transluminal angioplasty and stent placement left superficial femoral artery and popliteal 4. Star close closure right common femoral arteriotomy  The patient denies amaurosis fugax or recent TIA symptoms. There are no recent neurological changes noted. The patient denies history of DVT, PE or superficial thrombophlebitis. The patient denies recent episodes of angina or shortness of breath.   ABI's Rt=0.76 and Lt=0.92  (no previous) Duplex US of the monophasic waveforms in the right tibial arteries with biphasic monophasic waveforms in the left tibial arteries with good toe waveforms bilaterally.   Review of Systems  Cardiovascular: Positive for leg swelling.       Objective:   Physical Exam Vitals reviewed.  HENT:     Head: Normocephalic.  Cardiovascular:     Rate and Rhythm: Normal  rate and regular rhythm.     Pulses: Normal pulses.     Heart sounds: Normal heart sounds.  Pulmonary:     Effort: Pulmonary effort is normal.     Breath sounds: Normal breath sounds.  Musculoskeletal:     Left lower leg: Edema present.  Skin:    General: Skin is warm and dry.     Capillary Refill: Capillary refill takes less than 2 seconds.  Neurological:     Mental Status: She is alert and oriented to person, place, and time.  Psychiatric:        Mood and Affect: Mood normal.        Behavior: Behavior normal.        Thought Content: Thought content normal.        Judgment: Judgment normal.     BP (!) 150/77 (BP Location: Right Arm)   Pulse 75   Resp 16   Wt 201 lb (91.2 kg)   BMI 33.45 kg/m   Past Medical History:  Diagnosis Date  . A-fib (HCC)   . COPD (chronic obstructive pulmonary disease) (HCC)   . DVT (deep venous thrombosis) (HCC)   . Hypertension     Social History   Socioeconomic History  . Marital status: Married    Spouse name: Not on file  . Number of children: Not on file  . Years of education: Not on file  . Highest education level: Not on file  Occupational History  . Not on file  Tobacco Use  . Smoking status: Former Games developer  . Smokeless tobacco: Never Used  Vaping Use  . Vaping Use: Never used  Substance and Sexual Activity  .  Alcohol use: Yes    Comment: occasionally  . Drug use: Not on file  . Sexual activity: Not on file  Other Topics Concern  . Not on file  Social History Narrative  . Not on file   Social Determinants of Health   Financial Resource Strain:   . Difficulty of Paying Living Expenses:   Food Insecurity:   . Worried About Programme researcher, broadcasting/film/video in the Last Year:   . Barista in the Last Year:   Transportation Needs:   . Freight forwarder (Medical):   Marland Kitchen Lack of Transportation (Non-Medical):   Physical Activity:   . Days of Exercise per Week:   . Minutes of Exercise per Session:   Stress:   . Feeling of  Stress :   Social Connections:   . Frequency of Communication with Friends and Family:   . Frequency of Social Gatherings with Friends and Family:   . Attends Religious Services:   . Active Member of Clubs or Organizations:   . Attends Banker Meetings:   Marland Kitchen Marital Status:   Intimate Partner Violence:   . Fear of Current or Ex-Partner:   . Emotionally Abused:   Marland Kitchen Physically Abused:   . Sexually Abused:     Past Surgical History:  Procedure Laterality Date  . LOWER EXTREMITY ANGIOGRAM    . LOWER EXTREMITY ANGIOGRAPHY Left 07/29/2019   Procedure: Lower Extremity Angiography;  Surgeon: Renford Dills, MD;  Location: [facility] INVASIVE CV LAB;  Service: Cardiovascular;  Laterality: Left;    History reviewed. No pertinent family history.  Allergies  Allergen Reactions  . Penicillins Anaphylaxis  . Tape Rash and Other (See Comments)    Band-Aid, medical tape, etc. / Severe rash, open wounds        Assessment & Plan:   1. PVD (peripheral vascular disease) (HCC) Recommend:  The patient is status post successful angiogram with intervention.  The patient reports that the claudication symptoms and leg pain is essentially gone.   The patient denies lifestyle limiting changes at this point in time.  No further invasive studies, angiography or surgery at this time The patient should continue walking and begin a more formal exercise program.  The patient should continue antiplatelet therapy and aggressive treatment of the lipid abnormalities  The patient should continue wearing graduated compression socks 10-15 mmHg strength to control the mild edema.  Patient should undergo noninvasive studies as ordered. The patient will follow up with me after the studies.    2. COPD with chronic bronchitis (HCC) Continue pulmonary medications and aerosols as already ordered, these medications have been reviewed and there are no changes at this time.  Patient will follow up with  noninvasive studies in 3 months.  3. Atrial fibrillation, chronic (HCC) Continue antiarrhythmia medications as already ordered, these medications have been reviewed and there are no changes at this time.  Continue anticoagulation as ordered by Cardiology Service   4. Leg swelling The patient does have some extensive edema post intervention.  We will place the patient in Unna wraps in order to help regain control of lower extremity edema.  She will present to the office on a weekly basis to have her wraps changed.  We will reevaluate her swelling in 4 weeks.   Current Outpatient Medications on File Prior to Visit  Medication Sig Dispense Refill  . acetaminophen (TYLENOL) 325 MG tablet Take 2 tablets (650 mg total) by mouth every 6 (six) hours as  needed for mild pain or headache.    . albuterol (VENTOLIN HFA) 108 (90 Base) MCG/ACT inhaler Inhale 1-2 puffs into the lungs every 6 (six) hours as needed for wheezing or shortness of breath.    Marland Kitchen apixaban (ELIQUIS) 5 MG TABS tablet Take 5 mg by mouth 2 (two) times daily.    . clopidogrel (PLAVIX) 75 MG tablet Take 1 tablet (75 mg total) by mouth daily with breakfast. 30 tablet 0  . donepezil (ARICEPT) 10 MG tablet Take 1 tablet (10 mg total) by mouth daily. 30 tablet 0  . LATANOPROST OP Place 1 drop into both eyes daily.    Marland Kitchen losartan (COZAAR) 100 MG tablet Take 100 mg by mouth daily.    . Vitamin D, Ergocalciferol, (DRISDOL) 1.25 MG (50000 UNIT) CAPS capsule Take 50,000 Units by mouth every 7 (seven) days. (Patient not taking: Reported on 08/09/2019)     No current facility-administered medications on file prior to visit.    There are no Patient Instructions on file for this visit. No follow-ups on file.   Georgiana Spinner, NP

## 2019-08-12 NOTE — Progress Notes (Signed)
History of Present Illness  There is no documented history at this time  Assessments & Plan   There are no diagnoses linked to this encounter.    Additional instructions  Subjective:  Patient presents with venous ulcer of the Left lower extremity.    Procedure:  3 layer unna wrap was placed Left lower extremity.   Plan:   Follow up in one week.  

## 2019-08-16 ENCOUNTER — Encounter (INDEPENDENT_AMBULATORY_CARE_PROVIDER_SITE_OTHER): Payer: Self-pay | Admitting: Nurse Practitioner

## 2019-08-16 ENCOUNTER — Encounter (INDEPENDENT_AMBULATORY_CARE_PROVIDER_SITE_OTHER): Payer: Medicare Other

## 2019-08-19 ENCOUNTER — Other Ambulatory Visit: Payer: Self-pay

## 2019-08-19 ENCOUNTER — Encounter (INDEPENDENT_AMBULATORY_CARE_PROVIDER_SITE_OTHER): Payer: Self-pay

## 2019-08-19 ENCOUNTER — Ambulatory Visit (INDEPENDENT_AMBULATORY_CARE_PROVIDER_SITE_OTHER): Payer: Medicare Other | Admitting: Nurse Practitioner

## 2019-08-19 VITALS — BP 139/76 | HR 72 | Resp 16 | Ht 65.5 in | Wt 197.0 lb

## 2019-08-19 DIAGNOSIS — I890 Lymphedema, not elsewhere classified: Secondary | ICD-10-CM

## 2019-08-19 NOTE — Progress Notes (Signed)
History of Present Illness  There is no documented history at this time  Assessments & Plan   There are no diagnoses linked to this encounter.    Additional instructions  Subjective:  Patient presents with venous ulcer of the Left lower extremity.    Procedure:  3 layer unna wrap was placed Left lower extremity.   Plan:   Follow up in one week.  

## 2019-08-21 ENCOUNTER — Encounter (INDEPENDENT_AMBULATORY_CARE_PROVIDER_SITE_OTHER): Payer: Self-pay | Admitting: Nurse Practitioner

## 2019-08-24 ENCOUNTER — Encounter (INDEPENDENT_AMBULATORY_CARE_PROVIDER_SITE_OTHER): Payer: Self-pay | Admitting: Nurse Practitioner

## 2019-08-24 ENCOUNTER — Ambulatory Visit (INDEPENDENT_AMBULATORY_CARE_PROVIDER_SITE_OTHER): Payer: Medicare Other | Admitting: Nurse Practitioner

## 2019-08-24 ENCOUNTER — Other Ambulatory Visit: Payer: Self-pay

## 2019-08-24 VITALS — BP 160/79 | HR 70 | Resp 16 | Wt 196.0 lb

## 2019-08-24 DIAGNOSIS — R200 Anesthesia of skin: Secondary | ICD-10-CM

## 2019-08-24 DIAGNOSIS — I739 Peripheral vascular disease, unspecified: Secondary | ICD-10-CM | POA: Diagnosis not present

## 2019-08-24 DIAGNOSIS — M7989 Other specified soft tissue disorders: Secondary | ICD-10-CM

## 2019-08-26 ENCOUNTER — Ambulatory Visit (INDEPENDENT_AMBULATORY_CARE_PROVIDER_SITE_OTHER): Payer: Medicare Other | Admitting: Nurse Practitioner

## 2019-08-29 ENCOUNTER — Encounter (INDEPENDENT_AMBULATORY_CARE_PROVIDER_SITE_OTHER): Payer: Self-pay | Admitting: Nurse Practitioner

## 2019-08-29 NOTE — Progress Notes (Signed)
Subjective:    Patient ID: Kelli Morgan, female    DOB: [birth year], 74 y.o.   MRN: [id number] Chief Complaint  Patient presents with  . Follow-up    unna check     The patient presents today for follow-up regarding left lower extremity edema after angiogram.  The patient had an ischemic leg which was successfully revascularized.  The patient still continues to complain of numbness in her left foot.  The patient also notes that the swelling is greatly improved however symptoms still persist.  She was able to tolerate the Unna wrap well mostly.  The patient denies any lower extremity weeping or ulcerations.  She denies any fever, chills, nausea, vomiting or diarrhea.   Review of Systems  Cardiovascular: Positive for leg swelling.  Neurological: Positive for numbness.  All other systems reviewed and are negative.      Objective:   Physical Exam Vitals reviewed.  HENT:     Head: Normocephalic.  Cardiovascular:     Rate and Rhythm: Normal rate.     Pulses: Normal pulses.  Pulmonary:     Effort: Pulmonary effort is normal.  Musculoskeletal:     Left lower leg: Edema present.  Skin:    General: Skin is warm and dry.     Capillary Refill: Capillary refill takes less than 2 seconds.  Neurological:     Mental Status: She is alert and oriented to person, place, and time.  Psychiatric:        Mood and Affect: Mood normal.        Behavior: Behavior normal.        Thought Content: Thought content normal.        Judgment: Judgment normal.     BP (!) 160/79 (BP Location: Right Arm)   Pulse 70   Resp 16   Wt 196 lb (88.9 kg)   BMI 32.12 kg/m   Past Medical History:  Diagnosis Date  . A-fib (HCC)   . COPD (chronic obstructive pulmonary disease) (HCC)   . DVT (deep venous thrombosis) (HCC)   . Hypertension     Social History   Socioeconomic History  . Marital status: Married    Spouse name: Not on file  . Number of children: Not on file  . Years of education:  Not on file  . Highest education level: Not on file  Occupational History  . Not on file  Tobacco Use  . Smoking status: Former Games developer  . Smokeless tobacco: Never Used  Vaping Use  . Vaping Use: Never used  Substance and Sexual Activity  . Alcohol use: Yes    Comment: occasionally  . Drug use: Not on file  . Sexual activity: Not on file  Other Topics Concern  . Not on file  Social History Narrative  . Not on file   Social Determinants of Health   Financial Resource Strain:   . Difficulty of Paying Living Expenses:   Food Insecurity:   . Worried About Programme researcher, broadcasting/film/video in the Last Year:   . Barista in the Last Year:   Transportation Needs:   . Freight forwarder (Medical):   Marland Kitchen Lack of Transportation (Non-Medical):   Physical Activity:   . Days of Exercise per Week:   . Minutes of Exercise per Session:   Stress:   . Feeling of Stress :   Social Connections:   . Frequency of Communication with Friends and Family:   . Frequency of  Social Gatherings with Friends and Family:   . Attends Religious Services:   . Active Member of Clubs or Organizations:   . Attends Banker Meetings:   Marland Kitchen Marital Status:   Intimate Partner Violence:   . Fear of Current or Ex-Partner:   . Emotionally Abused:   Marland Kitchen Physically Abused:   . Sexually Abused:     Past Surgical History:  Procedure Laterality Date  . LOWER EXTREMITY ANGIOGRAM    . LOWER EXTREMITY ANGIOGRAPHY Left 07/29/2019   Procedure: Lower Extremity Angiography;  Surgeon: Renford Dills, MD;  Location: [facility] INV[facility]IVE [facility];  Service: Cardiovascular;  Laterality: Left;    History reviewed. No pertinent family history.  Allergies  Allergen Reactions  . Penicillins Anaphylaxis  . Tape Rash and Other (See Comments)    Band-Aid, medical tape, etc. / Severe rash, open wounds        Assessment & Plan:   1. PVD (peripheral vascular disease) (HCC) Patient's previous studies indicate that she had  successful revascularization.  We will continue on normal follow-up schedule have the patient return in 3 months with noninvasive studies.  2. Leg swelling Patient is advised to transition to medical grade 1 compression stockings 20 to 30 mmHg for control of leg swelling.  Patient is also advised to elevate her lower extremities much as possible in addition to walking daily.  Daily exercise will also be helpful for the patient's peripheral vascular disease.  3. Numbness Due to lack of medical records from the patient's previous vascular surgeon, we are uncertain if the patient had any previous intervention to her left lower extremity prior to the ischemic event.  However we do know that the patient has had the numbness before she relocated to [facility].  It may be due to prolonged ischemia which may resolve.  Also discussed the patient that this may be a lifelong complication, in which case management for neuropathy will be indicated.  We will reevaluate the patient's discomfort at her follow-up visit   Current Outpatient Medications on File Prior to Visit  Medication Sig Dispense Refill  . acetaminophen (TYLENOL) 325 MG tablet Take 2 tablets (650 mg total) by mouth every 6 (six) hours as needed for mild pain or headache.    . albuterol (VENTOLIN HFA) 108 (90 Base) MCG/ACT inhaler Inhale 1-2 puffs into the lungs every 6 (six) hours as needed for wheezing or shortness of breath.    Marland Kitchen apixaban (ELIQUIS) 5 MG TABS tablet Take 5 mg by mouth 2 (two) times daily.    . clopidogrel (PLAVIX) 75 MG tablet Take 1 tablet (75 mg total) by mouth daily with breakfast. 30 tablet 0  . donepezil (ARICEPT) 10 MG tablet Take 1 tablet (10 mg total) by mouth daily. 30 tablet 0  . LATANOPROST OP Place 1 drop into both eyes daily.    Marland Kitchen losartan (COZAAR) 100 MG tablet Take 100 mg by mouth daily.    . Vitamin D, Ergocalciferol, (DRISDOL) 1.25 MG (50000 UNIT) CAPS capsule Take 50,000 Units by mouth every 7 (seven) days.  (Patient not taking: Reported on 08/09/2019)     No current facility-administered medications on file prior to visit.    There are no Patient Instructions on file for this visit. No follow-ups on file.   Georgiana Spinner, NP

## 2019-09-01 ENCOUNTER — Ambulatory Visit (INDEPENDENT_AMBULATORY_CARE_PROVIDER_SITE_OTHER): Payer: Medicare Other | Admitting: Nurse Practitioner

## 2019-11-16 ENCOUNTER — Ambulatory Visit: Payer: Medicare Other | Admitting: Family Medicine

## 2019-11-23 ENCOUNTER — Other Ambulatory Visit (INDEPENDENT_AMBULATORY_CARE_PROVIDER_SITE_OTHER): Payer: Self-pay | Admitting: Nurse Practitioner

## 2019-11-23 DIAGNOSIS — I739 Peripheral vascular disease, unspecified: Secondary | ICD-10-CM

## 2019-11-24 ENCOUNTER — Ambulatory Visit (INDEPENDENT_AMBULATORY_CARE_PROVIDER_SITE_OTHER): Payer: Medicare Other | Admitting: Nurse Practitioner

## 2019-11-24 ENCOUNTER — Encounter (INDEPENDENT_AMBULATORY_CARE_PROVIDER_SITE_OTHER): Payer: Self-pay | Admitting: Nurse Practitioner

## 2019-11-24 ENCOUNTER — Other Ambulatory Visit: Payer: Self-pay

## 2019-11-24 ENCOUNTER — Ambulatory Visit (INDEPENDENT_AMBULATORY_CARE_PROVIDER_SITE_OTHER): Payer: Medicare Other

## 2019-11-24 VITALS — BP 177/80 | HR 62 | Ht 65.0 in | Wt 195.0 lb

## 2019-11-24 DIAGNOSIS — I739 Peripheral vascular disease, unspecified: Secondary | ICD-10-CM

## 2019-11-24 DIAGNOSIS — I890 Lymphedema, not elsewhere classified: Secondary | ICD-10-CM | POA: Diagnosis not present

## 2019-11-24 DIAGNOSIS — R200 Anesthesia of skin: Secondary | ICD-10-CM | POA: Diagnosis not present

## 2019-11-24 MED ORDER — GABAPENTIN 300 MG PO CAPS: 300.0000 mg | ORAL_CAPSULE | Freq: Every day | ORAL | 3 refills | Status: AC

## 2019-11-27 NOTE — Progress Notes (Signed)
Subjective:    Patient ID: Kelli Morgan, female    DOB: 02/25/1945, 74 y.o.   MRN: [id number] Chief Complaint  Patient presents with  . Follow-up    79mo follow up    The patient returns to the office for followup and review of the noninvasive studies. There have been no interval changes in lower extremity symptoms. No interval shortening of the patient's claudication distance or development of rest pain symptoms. No new ulcers or wounds have occurred since the last visit.  The patient continues to endorse numbness of her lower extremities.  The patient's husband notes that she is beginning to have worsening symptoms of dementia and complains regularly of numbness and discomfort in her feet.  There have been no significant changes to the patient's overall health care.  The patient denies amaurosis fugax or recent TIA symptoms. There are no recent neurological changes noted. The patient denies history of DVT, PE or superficial thrombophlebitis. The patient denies recent episodes of angina or shortness of breath.   ABI Rt=0.71 and Lt=0.89  (previous ABI's Rt=0.76 and Lt=0.92) Duplex ultrasound of the tibial arteries revealed biphasic/monophasic waveforms with good toe waveforms bilaterally.   Review of Systems  Cardiovascular: Positive for leg swelling.  Neurological: Positive for numbness.  All other systems reviewed and are negative.      Objective:   Physical Exam Vitals reviewed.  HENT:     Head: Normocephalic.  Cardiovascular:     Rate and Rhythm: Normal rate.     Pulses:          Dorsalis pedis pulses are detected w/ Doppler on the right side and 1+ on the left side.       Posterior tibial pulses are detected w/ Doppler on the right side and 1+ on the left side.  Pulmonary:     Effort: Pulmonary effort is normal.  Musculoskeletal:     Right lower leg: Edema present.     Left lower leg: Edema present.  Neurological:     Mental Status: She is alert and oriented  to person, place, and time.  Psychiatric:        Mood and Affect: Mood normal.        Behavior: Behavior normal.        Thought Content: Thought content normal.        Judgment: Judgment normal.     BP (!) 177/80   Pulse 62   Ht 5\' 5"  (1.651 m)   Wt 195 lb (88.5 kg)   BMI 32.45 kg/m   Past Medical History:  Diagnosis Date  . A-fib (HCC)   . COPD (chronic obstructive pulmonary disease) (HCC)   . DVT (deep venous thrombosis) (HCC)   . Hypertension     Social History   Socioeconomic History  . Marital status: Married    Spouse name: Not on file  . Number of children: Not on file  . Years of education: Not on file  . Highest education level: Not on file  Occupational History  . Not on file  Tobacco Use  . Smoking status: Former  . Smokeless tobacco: Never Used  Vaping Use  . Vaping Use: Never used  Substance and Sexual Activity  . Alcohol use: Yes    Comment: occasionally  . Drug use: Not on file  . Sexual activity: Not on file  Other Topics Concern  . Not on file  Social History Narrative  . Not on file   Social Determinants  of Health   Financial Resource Strain:   . Difficulty of Paying Living Expenses: Not on file  Food Insecurity:   . Worried About Programme researcher, broadcasting/film/video in the Last Year: Not on file  . Ran Out of Food in the Last Year: Not on file  Transportation Needs:   . Lack of Transportation (Medical): Not on file  . Lack of Transportation (Non-Medical): Not on file  Physical Activity:   . Days of Exercise per Week: Not on file  . Minutes of Exercise per Session: Not on file  Stress:   . Feeling of Stress : Not on file  Social Connections:   . Frequency of Communication with Friends and Family: Not on file  . Frequency of Social Gatherings with Friends and Family: Not on file  . Attends Religious Services: Not on file  . Active Member of Clubs or Organizations: Not on file  . Attends Banker Meetings: Not on file  . Marital  Status: Not on file  Intimate Partner Violence:   . Fear of Current or Ex-Partner: Not on file  . Emotionally Abused: Not on file  . Physically Abused: Not on file  . Sexually Abused: Not on file    Past Surgical History:  Procedure Laterality Date  . LOWER EXTREMITY ANGIOGRAM    . LOWER EXTREMITY ANGIOGRAPHY Left 07/29/2019   Procedure: Lower Extremity Angiography;  Surgeon: Renford Dills, MD;  Location: [facility];  Service: Cardiovascular;  Laterality: Left;    History reviewed. No pertinent family history.  Allergies  Allergen Reactions  . Penicillins Anaphylaxis  . Tape Rash and Other (See Comments)    Band-Aid, medical tape, etc. / Severe rash, open wounds     CBC Latest Ref Rng & Units 07/29/2019  WBC 4.0 - 10.5 K/uL 7.5  Hemoglobin 12.0 - 15.0 g/dL 70.6  Hematocrit 36 - 46 % 38.2  Platelets 150 - 400 K/uL 214      CMP     Component Value Date/Time   NA 141 07/29/2019 0043   K 4.4 07/29/2019 0043   CL 104 07/29/2019 0043   CO2 26 07/29/2019 0043   GLUCOSE 99 07/29/2019 0043   BUN 23 07/29/2019 0043   CREATININE 0.75 07/29/2019 0043   CALCIUM 9.6 07/29/2019 0043   GFRNONAA >60 07/29/2019 0043   GFRAA >60 07/29/2019 0043       Assessment & Plan:   1. PVD (peripheral vascular disease) (HCC)  Recommend:  The patient has evidence of atherosclerosis of the lower extremities with claudication.  The patient does not voice lifestyle limiting changes at this point in time.  Noninvasive studies do not suggest clinically significant change.  No invasive studies, angiography or surgery at this time The patient should continue walking and begin a more formal exercise program.  The patient should continue antiplatelet therapy and aggressive treatment of the lipid abnormalities  No changes in the patient's medications at this time  The patient should continue wearing graduated compression socks 10-15 mmHg strength to control the mild edema.    2.  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  We will begin with gabapentin nightly to see how it is tolerated.  We can titrate up based on patient's response.  Currently patient does not have a primary care physician.  Once patient does and will defer to primary care for further management. - gabapentin (NEURONTIN) 300 MG capsule; Take 1 capsule (300 mg total) by  mouth at bedtime.  Dispense: 30 capsule; Refill: 3  3. Lymphedema  No surgery or intervention at this point in time.    I have reviewed my discussion with the patient regarding lymphedema and why it  causes symptoms.  Patient will continue wearing graduated compression stockings class 1 (20-30 mmHg) on a daily basis a prescription was given. The patient is reminded to put the stockings on first thing in the morning and removing them in the evening. The patient is instructed specifically not to sleep in the stockings.   In addition, behavioral modification throughout the day will be continued.  This will include frequent elevation (such as in a recliner), use of over the counter pain medications as needed and exercise such as walking.  I have reviewed systemic causes for chronic edema such as liver, kidney and cardiac etiologies and there does not appear to be any significant changes in these organ systems over the past year.  The patient is under the impression that these organ systems are all stable and unchanged.       Current Outpatient Medications on File Prior to Visit  Medication Sig Dispense Refill  . acetaminophen (TYLENOL) 325 MG tablet Take 2 tablets (650 mg total) by mouth every 6 (six) hours as needed for mild pain or headache.    . albuterol (VENTOLIN HFA) 108 (90 Base) MCG/ACT inhaler Inhale 1-2 puffs into the lungs every 6 (six) hours as needed for wheezing or shortness of breath.    Marland Kitchen apixaban (ELIQUIS) 5 MG TABS tablet Take 5 mg by mouth 2 (two) times daily.    . clopidogrel (PLAVIX) 75 MG  tablet Take 1 tablet (75 mg total) by mouth daily with breakfast. 30 tablet 0  . donepezil (ARICEPT) 10 MG tablet Take 1 tablet (10 mg total) by mouth daily. 30 tablet 0  . LATANOPROST OP Place 1 drop into both eyes daily.    Marland Kitchen losartan (COZAAR) 100 MG tablet Take 100 mg by mouth daily.    . Vitamin D, Ergocalciferol, (DRISDOL) 1.25 MG (50000 UNIT) CAPS capsule Take 50,000 Units by mouth every 7 (seven) days.      No current facility-administered medications on file prior to visit.    There are no Patient Instructions on file for this visit. No follow-ups on file.   Georgiana Spinner, NP

## 2019-12-30 ENCOUNTER — Ambulatory Visit: Payer: Medicare Other | Admitting: Family Medicine

## 2020-03-26 ENCOUNTER — Other Ambulatory Visit (INDEPENDENT_AMBULATORY_CARE_PROVIDER_SITE_OTHER): Payer: Self-pay | Admitting: Nurse Practitioner

## 2020-05-03 ENCOUNTER — Telehealth (INDEPENDENT_AMBULATORY_CARE_PROVIDER_SITE_OTHER): Payer: Self-pay | Admitting: Vascular Surgery

## 2020-05-03 NOTE — Telephone Encounter (Signed)
Called stating that patients'  foot/leg

## 2020-05-25 ENCOUNTER — Other Ambulatory Visit (INDEPENDENT_AMBULATORY_CARE_PROVIDER_SITE_OTHER): Payer: Self-pay | Admitting: Nurse Practitioner

## 2020-05-25 DIAGNOSIS — I739 Peripheral vascular disease, unspecified: Secondary | ICD-10-CM

## 2020-05-28 ENCOUNTER — Other Ambulatory Visit: Payer: Self-pay

## 2020-05-28 ENCOUNTER — Ambulatory Visit (INDEPENDENT_AMBULATORY_CARE_PROVIDER_SITE_OTHER): Payer: Medicare Other

## 2020-05-28 ENCOUNTER — Encounter (INDEPENDENT_AMBULATORY_CARE_PROVIDER_SITE_OTHER): Payer: Medicare Other

## 2020-05-28 ENCOUNTER — Ambulatory Visit (INDEPENDENT_AMBULATORY_CARE_PROVIDER_SITE_OTHER): Payer: Medicare Other | Admitting: Vascular Surgery

## 2020-05-28 ENCOUNTER — Encounter (INDEPENDENT_AMBULATORY_CARE_PROVIDER_SITE_OTHER): Payer: Self-pay | Admitting: Vascular Surgery

## 2020-05-28 DIAGNOSIS — I482 Chronic atrial fibrillation, unspecified: Secondary | ICD-10-CM

## 2020-05-28 DIAGNOSIS — I70213 Atherosclerosis of native arteries of extremities with intermittent claudication, bilateral legs: Secondary | ICD-10-CM

## 2020-05-28 DIAGNOSIS — I10 Essential (primary) hypertension: Secondary | ICD-10-CM

## 2020-05-28 DIAGNOSIS — J449 Chronic obstructive pulmonary disease, unspecified: Secondary | ICD-10-CM

## 2020-05-28 DIAGNOSIS — I739 Peripheral vascular disease, unspecified: Secondary | ICD-10-CM | POA: Diagnosis not present

## 2020-05-28 DIAGNOSIS — I70219 Atherosclerosis of native arteries of extremities with intermittent claudication, unspecified extremity: Secondary | ICD-10-CM | POA: Insufficient documentation

## 2020-05-28 NOTE — Progress Notes (Signed)
MRN : [id number]  IMANE BURROUGH is a 75 y.o. ([birth year]) female who presents with chief complaint of No chief complaint on file. Marland Kitchen  History of Present Illness:   The patient returns to the office for followup and review of the noninvasive studies.  She is s/p Percutaneous transluminal angioplasty and stent placement left superficial femoral artery and popliteal on 07/29/2019  There have been no interval changes in lower extremity symptoms. No interval shortening of the patient's claudication distance or development of rest pain symptoms. No new ulcers or wounds have occurred since the last visit.  There have been no significant changes to the patient's overall health care.  The patient denies amaurosis fugax or recent TIA symptoms. There are no recent neurological changes noted. The patient denies history of DVT, PE or superficial thrombophlebitis. The patient denies recent episodes of angina or shortness of breath.   ABI Rt=0.65 and Lt=0.76  (previous ABI's Rt=0.71 and Lt=0.89)   No outpatient medications have been marked as taking for the 05/28/20 encounter (Appointment) with Gilda Crease, Latina Craver, MD.    Past Medical History:  Diagnosis Date  . A-fib (HCC)   . COPD (chronic obstructive pulmonary disease) (HCC)   . DVT (deep venous thrombosis) (HCC)   . Hypertension     Past Surgical History:  Procedure Laterality Date  . LOWER EXTREMITY ANGIOGRAM    . LOWER EXTREMITY ANGIOGRAPHY Left 07/29/2019   Procedure: Lower Extremity Angiography;  Surgeon: Renford Dills, MD;  Location: [facility] INVASIVE CV LAB;  Service: Cardiovascular;  Laterality: Left;    Social History Social History   Tobacco Use  . Smoking status: Former Games developer  . Smokeless tobacco: Never Used  Vaping Use  . Vaping Use: Never used  Substance Use Topics  . Alcohol use: Yes    Comment: occasionally    Family History No family history on file.  Allergies  Allergen Reactions  . Penicillins  Anaphylaxis  . Tape Rash and Other (See Comments)    Band-Aid, medical tape, etc. / Severe rash, open wounds      REVIEW OF SYSTEMS (Negative unless checked)  Constitutional: [] Weight loss  [] Fever  [] Chills Cardiac: [] Chest pain   [] Chest pressure   [] Palpitations   [] Shortness of breath when laying flat   [] Shortness of breath with exertion. Vascular:  [x] Pain in legs with walking   [] Pain in legs at rest  [] History of DVT   [] Phlebitis   [] Swelling in legs   [] Varicose veins   [] Non-healing ulcers Pulmonary:   [] Uses home oxygen   [] Productive cough   [] Hemoptysis   [] Wheeze  [x] COPD   [] Asthma Neurologic:  [] Dizziness   [] Seizures   [] History of stroke   [] History of TIA  [] Aphasia   [] Vissual changes   [] Weakness or numbness in arm   [] Weakness or numbness in leg Musculoskeletal:   [] Joint swelling   [x] Joint pain   [] Low back pain Hematologic:  [] Easy bruising  [] Easy bleeding   [] Hypercoagulable state   [] Anemic Gastrointestinal:  [] Diarrhea   [] Vomiting  [] Gastroesophageal reflux/heartburn   [] Difficulty swallowing. Genitourinary:  [] Chronic kidney disease   [] Difficult urination  [] Frequent urination   [] Blood in urine Skin:  [] Rashes   [] Ulcers  Psychological:  [] History of anxiety   []  History of major depression.  Physical Examination  There were no vitals filed for this visit. There is no height or weight on file to calculate BMI. Gen: WD/WN, NAD Head: [place]/AT, No temporalis wasting.  Ear/Nose/Throat: Hearing  grossly intact, nares w/o erythema or drainage Eyes: PER, EOMI, sclera nonicteric.  Neck: Supple, no large masses.   Pulmonary:  Good air movement, no audible wheezing bilaterally, no use of accessory muscles.  Cardiac: RRR, no JVD Vascular:  Vessel Right Left  Radial Palpable Palpable  PT Not Palpable Not Palpable  DP Not Palpable Not Palpable  Gastrointestinal: Non-distended. No guarding/no peritoneal signs.  Musculoskeletal: M/S 5/5 throughout.  No deformity or  atrophy.  Neurologic: CN 2-12 intact. Symmetrical.  Speech is fluent. Motor exam as listed above. Psychiatric: Judgment intact, Mood & affect appropriate for pt's clinical situation. Dermatologic: No rashes or ulcers noted.  No changes consistent with cellulitis.  CBC Lab Results  Component Value Date   WBC 7.5 07/29/2019   HGB 12.0 07/29/2019   HCT 38.2 07/29/2019   MCV 81.3 07/29/2019   PLT 214 07/29/2019    BMET    Component Value Date/Time   NA 141 07/29/2019 0043   K 4.4 07/29/2019 0043   CL 104 07/29/2019 0043   CO2 26 07/29/2019 0043   GLUCOSE 99 07/29/2019 0043   BUN 23 07/29/2019 0043   CREATININE 0.75 07/29/2019 0043   CALCIUM 9.6 07/29/2019 0043   GFRNONAA >60 07/29/2019 0043   GFRAA >60 07/29/2019 0043   CrCl cannot be calculated (Patient's most recent lab result is older than the maximum 21 days allowed.).  COAG Lab Results  Component Value Date   INR 1.1 07/29/2019    Radiology No results found.   Assessment/Plan 1. Atherosclerosis of native artery of both lower extremities with intermittent claudication (HCC) Recommend:  The patient is status post successful angiogram with intervention.  The patient reports that the claudication symptoms and leg pain is essentially gone.   The patient denies lifestyle limiting changes at this point in time.  No further invasive studies, angiography or surgery at this time The patient should continue walking and begin a more formal exercise program.  The patient should continue antiplatelet therapy and aggressive treatment of the lipid abnormalities  Smoking cessation was again discussed  The patient should continue wearing graduated compression socks 10-15 mmHg strength to control the mild edema.  Patient should undergo noninvasive studies as ordered. The patient will follow up with me after the studies.   - VAS [place] ABI WITH/WO TBI; Future - VAS [place] LOWER EXTREMITY ARTERIAL DUPLEX; Future  2. Atrial  fibrillation, chronic (HCC) Continue antiarrhythmia medications as already ordered, these medications have been reviewed and there are no changes at this time.  Continue anticoagulation as ordered by Cardiology Service   3. COPD with chronic bronchitis (HCC) Continue pulmonary medications and aerosols as already ordered, these medications have been reviewed and there are no changes at this time.    4. Essential hypertension Continue antihypertensive medications as already ordered, these medications have been reviewed and there are no changes at this time.     Levora Dredge, MD  05/28/2020 11:40 AM

## 2020-07-13 ENCOUNTER — Emergency Department: Payer: Medicare Other

## 2020-07-13 ENCOUNTER — Emergency Department
Admission: EM | Admit: 2020-07-13 | Discharge: 2020-07-13 | Disposition: A | Discharge status: Home / Self Care | Payer: Medicare Other | Attending: Emergency Medicine | Admitting: Emergency Medicine

## 2020-07-13 ENCOUNTER — Other Ambulatory Visit: Payer: Self-pay

## 2020-07-13 DIAGNOSIS — R0602 Shortness of breath: Secondary | ICD-10-CM | POA: Diagnosis not present

## 2020-07-13 DIAGNOSIS — Z5321 Procedure and treatment not carried out due to patient leaving prior to being seen by health care provider: Secondary | ICD-10-CM | POA: Insufficient documentation

## 2020-07-13 LAB — CBC
HCT: 35.1 % — ABNORMAL LOW (ref 36.0–46.0)
Hemoglobin: 10.8 g/dL — ABNORMAL LOW (ref 12.0–15.0)
MCH: 25.1 pg — ABNORMAL LOW (ref 26.0–34.0)
MCHC: 30.8 g/dL (ref 30.0–36.0)
MCV: 81.4 fL (ref 80.0–100.0)
Platelets: 269 10*3/uL (ref 150–400)
RBC: 4.31 MIL/uL (ref 3.87–5.11)
RDW: 16.7 % — ABNORMAL HIGH (ref 11.5–15.5)
WBC: 9.6 10*3/uL (ref 4.0–10.5)
nRBC: 0 % (ref 0.0–0.2)

## 2020-07-13 LAB — BASIC METABOLIC PANEL
Anion gap: 9 (ref 5–15)
BUN: 18 mg/dL (ref 8–23)
CO2: 28 mmol/L (ref 22–32)
Calcium: 8.9 mg/dL (ref 8.9–10.3)
Chloride: 102 mmol/L (ref 98–111)
Creatinine, Ser: 0.96 mg/dL (ref 0.44–1.00)
GFR, Estimated: >60 mL/min (ref >60)
Glucose, Bld: 107 mg/dL — ABNORMAL HIGH (ref 70–99)
Potassium: 3.9 mmol/L (ref 3.5–5.1)
Sodium: 139 mmol/L (ref 135–145)

## 2020-07-13 NOTE — ED Notes (Signed)
Checked pts SpO2 and got range from 89-91% RA. Pt put on 2L [place]. Pt transported to x-ray and will return to lobby waiting room.

## 2020-07-13 NOTE — ED Triage Notes (Signed)
Pt arrives via [facility] with c/o shortness of breath that got "worse than normal" last night and today. Pt denies oxygen use at home. Hx of COPD.

## 2020-07-13 NOTE — ED Notes (Signed)
First Nurse Note: Pt to ED via EMS from UC for COVID, UC reported sats in the 80's. EMS has pt on 2 liters with sat 98-100%. Pt is in NAD.

## 2020-11-26 ENCOUNTER — Ambulatory Visit (INDEPENDENT_AMBULATORY_CARE_PROVIDER_SITE_OTHER): Payer: Medicare Other

## 2020-11-26 ENCOUNTER — Other Ambulatory Visit: Payer: Self-pay

## 2020-11-26 ENCOUNTER — Ambulatory Visit (INDEPENDENT_AMBULATORY_CARE_PROVIDER_SITE_OTHER): Payer: Medicare Other | Admitting: Vascular Surgery

## 2020-11-26 DIAGNOSIS — I70213 Atherosclerosis of native arteries of extremities with intermittent claudication, bilateral legs: Secondary | ICD-10-CM | POA: Diagnosis not present

## 2020-12-03 ENCOUNTER — Encounter (INDEPENDENT_AMBULATORY_CARE_PROVIDER_SITE_OTHER): Payer: Self-pay | Admitting: *Deleted

## 2021-05-27 ENCOUNTER — Encounter (INDEPENDENT_AMBULATORY_CARE_PROVIDER_SITE_OTHER): Payer: Self-pay

## 2021-05-27 ENCOUNTER — Encounter (INDEPENDENT_AMBULATORY_CARE_PROVIDER_SITE_OTHER): Payer: Medicare Other

## 2021-05-27 ENCOUNTER — Ambulatory Visit (INDEPENDENT_AMBULATORY_CARE_PROVIDER_SITE_OTHER): Payer: Medicare Other | Admitting: Vascular Surgery

## 2021-09-17 IMAGING — US US EXTREM LOW VENOUS*L*
1 series · 14 of 24 positions shown · non-contrast
Comparison: None.

CLINICAL DATA: Left leg pain.

EXAM:
LEFT LOWER EXTREMITY VENOUS DOPPLER ULTRASOUND
TECHNIQUE: Gray-scale sonography with compression, as well as color and duplex
ultrasound, were performed to evaluate the deep venous system(s)
from the level of the common femoral vein through the popliteal and
proximal calf veins.

[Series 1: us venous img lower uni left (dvt) · portal-venous · 14 of 33 slices shown]
[im 1/33]
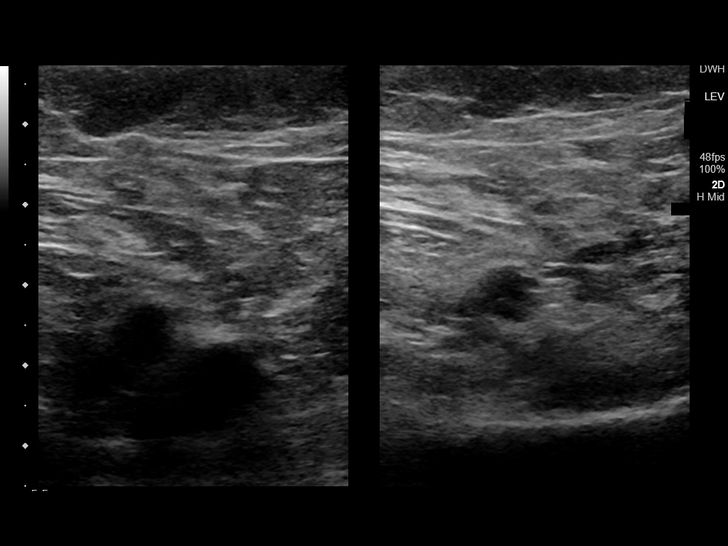
[im 3/33]
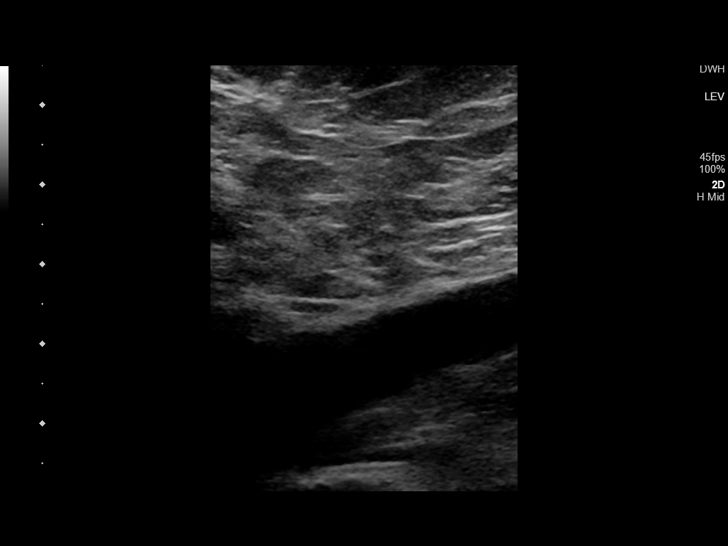
[im 6/33]
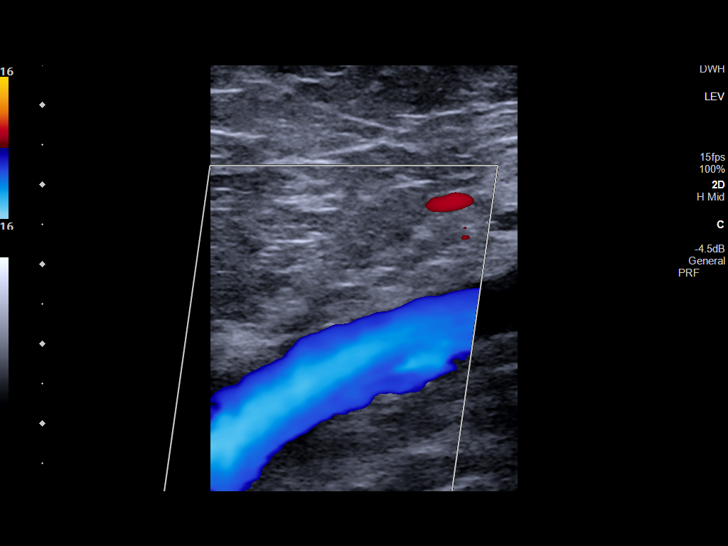
[im 9/33]
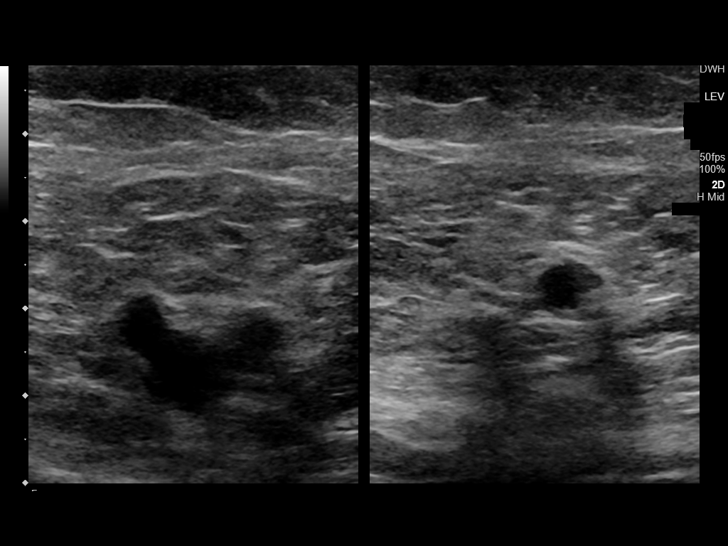
[im 10/33]
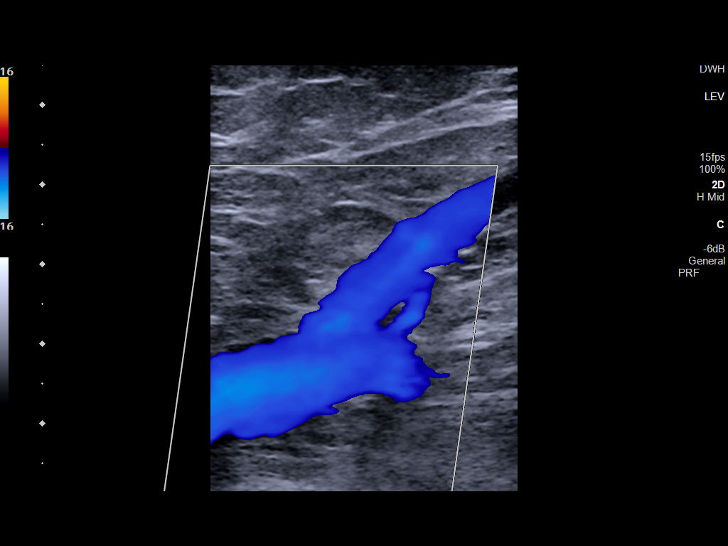
[im 13/33]
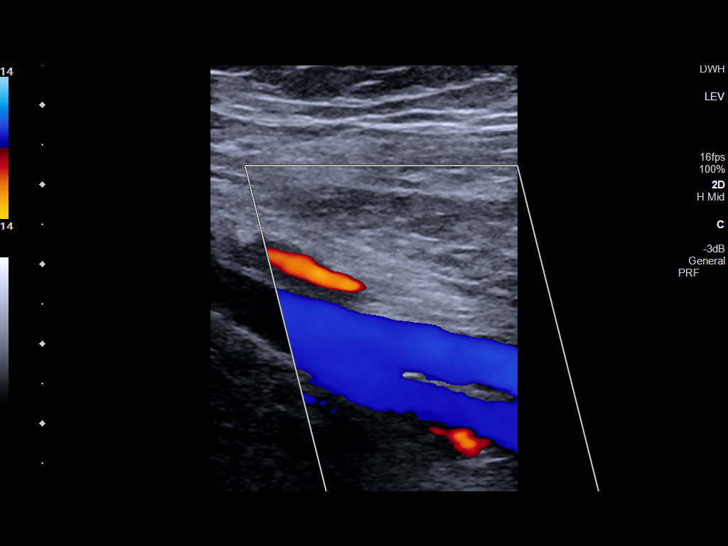
[im 16/33]
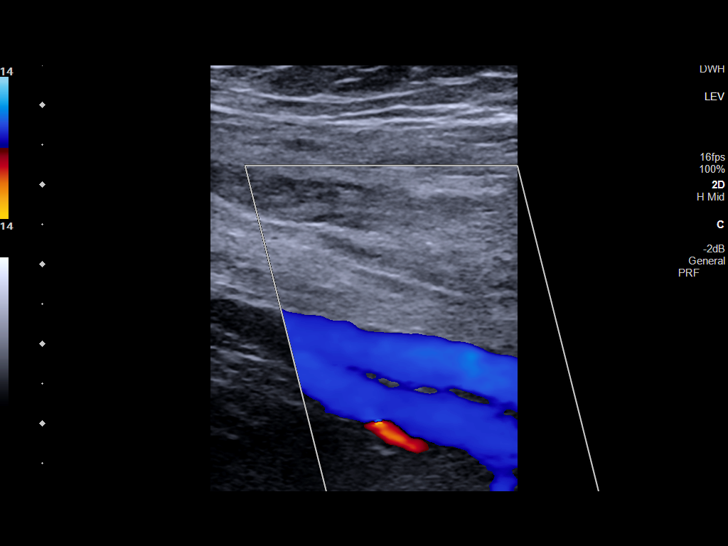
[im 17/33]
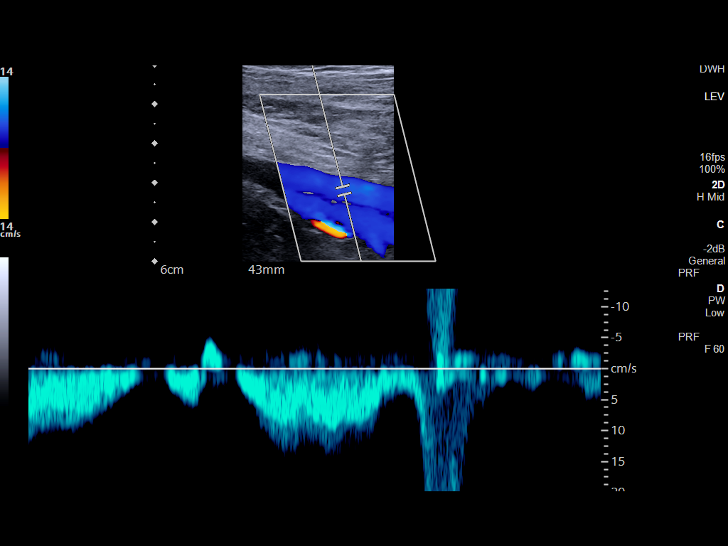
[im 20/33]
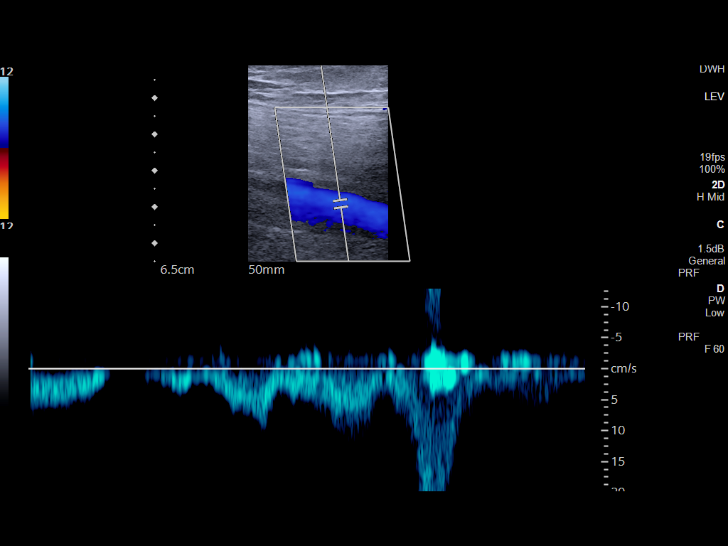
[im 23/33]
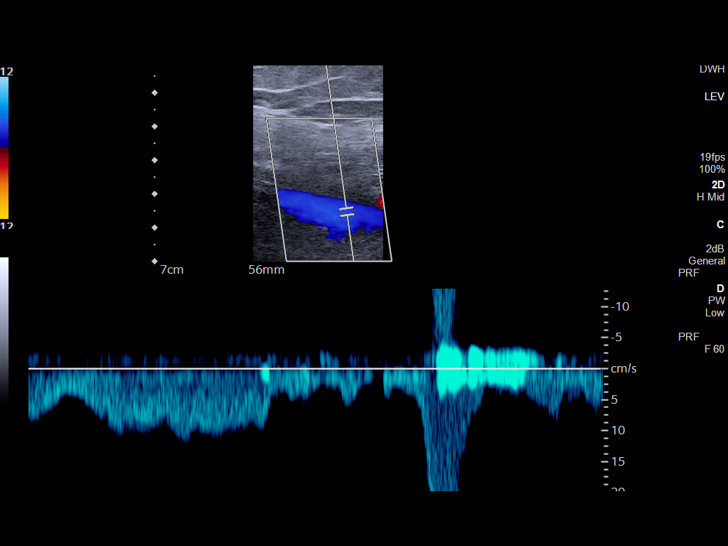
[im 26/33]
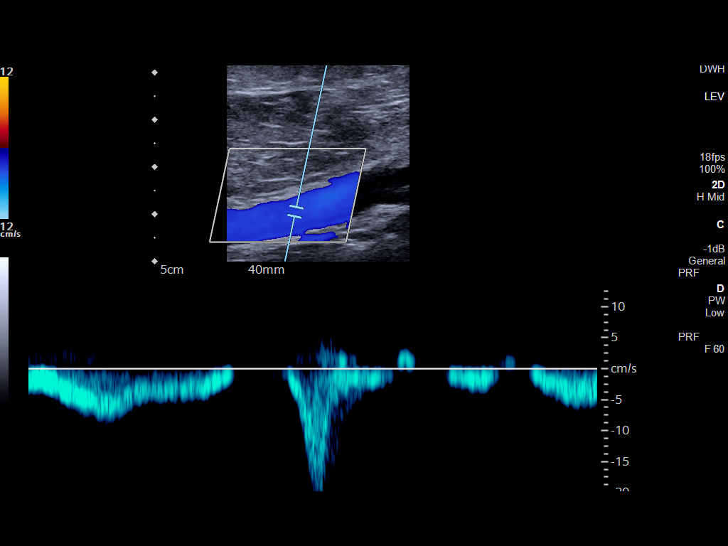
[im 27/33]
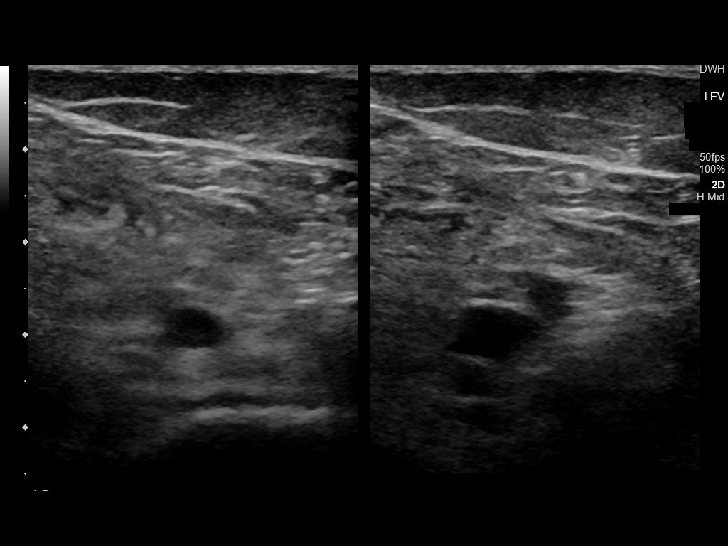
[im 30/33]
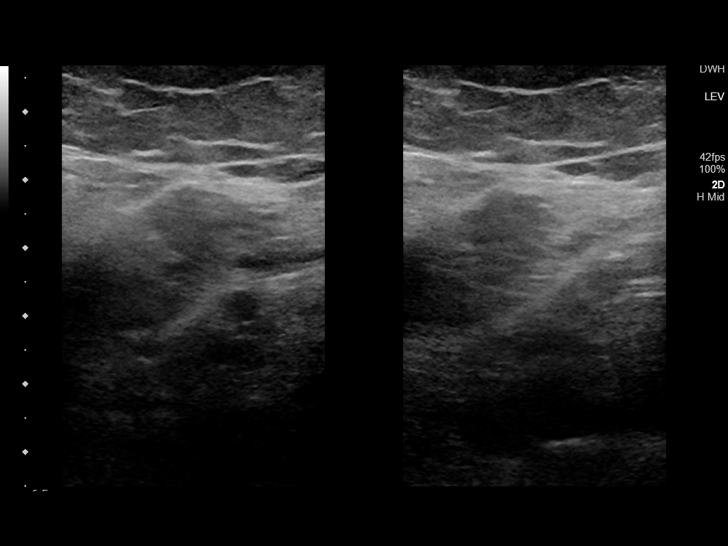
[im 33/33]
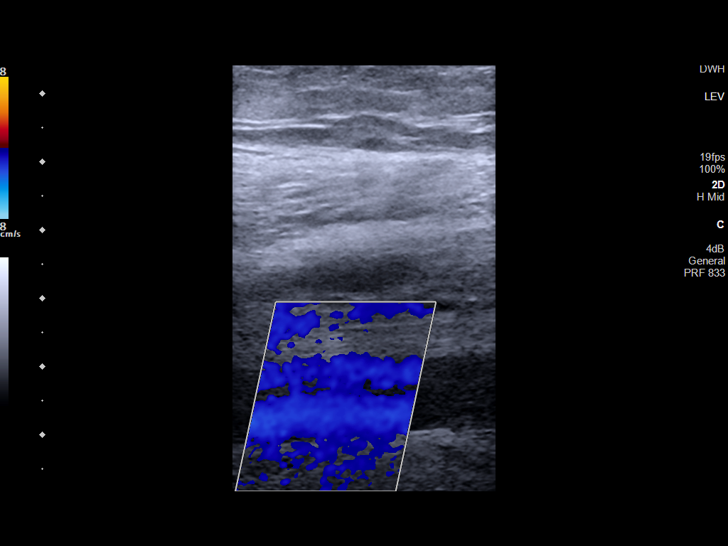

[14 of 24 positions shown; findings below may reference images not displayed]

FINDINGS: VENOUS

Normal compressibility of the common femoral, superficial femoral,
and popliteal veins, as well as the visualized calf veins.
Visualized portions of profunda femoral vein and great saphenous
vein unremarkable. No filling defects to suggest DVT on grayscale or
color Doppler imaging. Doppler waveforms show normal direction of
venous flow, normal respiratory plasticity and response to
augmentation.

Limited views of the contralateral common femoral vein are
unremarkable.

OTHER

None.
IMPRESSION: Negative.

## 2021-09-17 IMAGING — CR DG CHEST 2V
2 series · 2 of 2 positions shown · non-contrast
Comparison: None.

CLINICAL DATA: Left leg pain

EXAM:
CHEST - 2 VIEW

[chest pa]
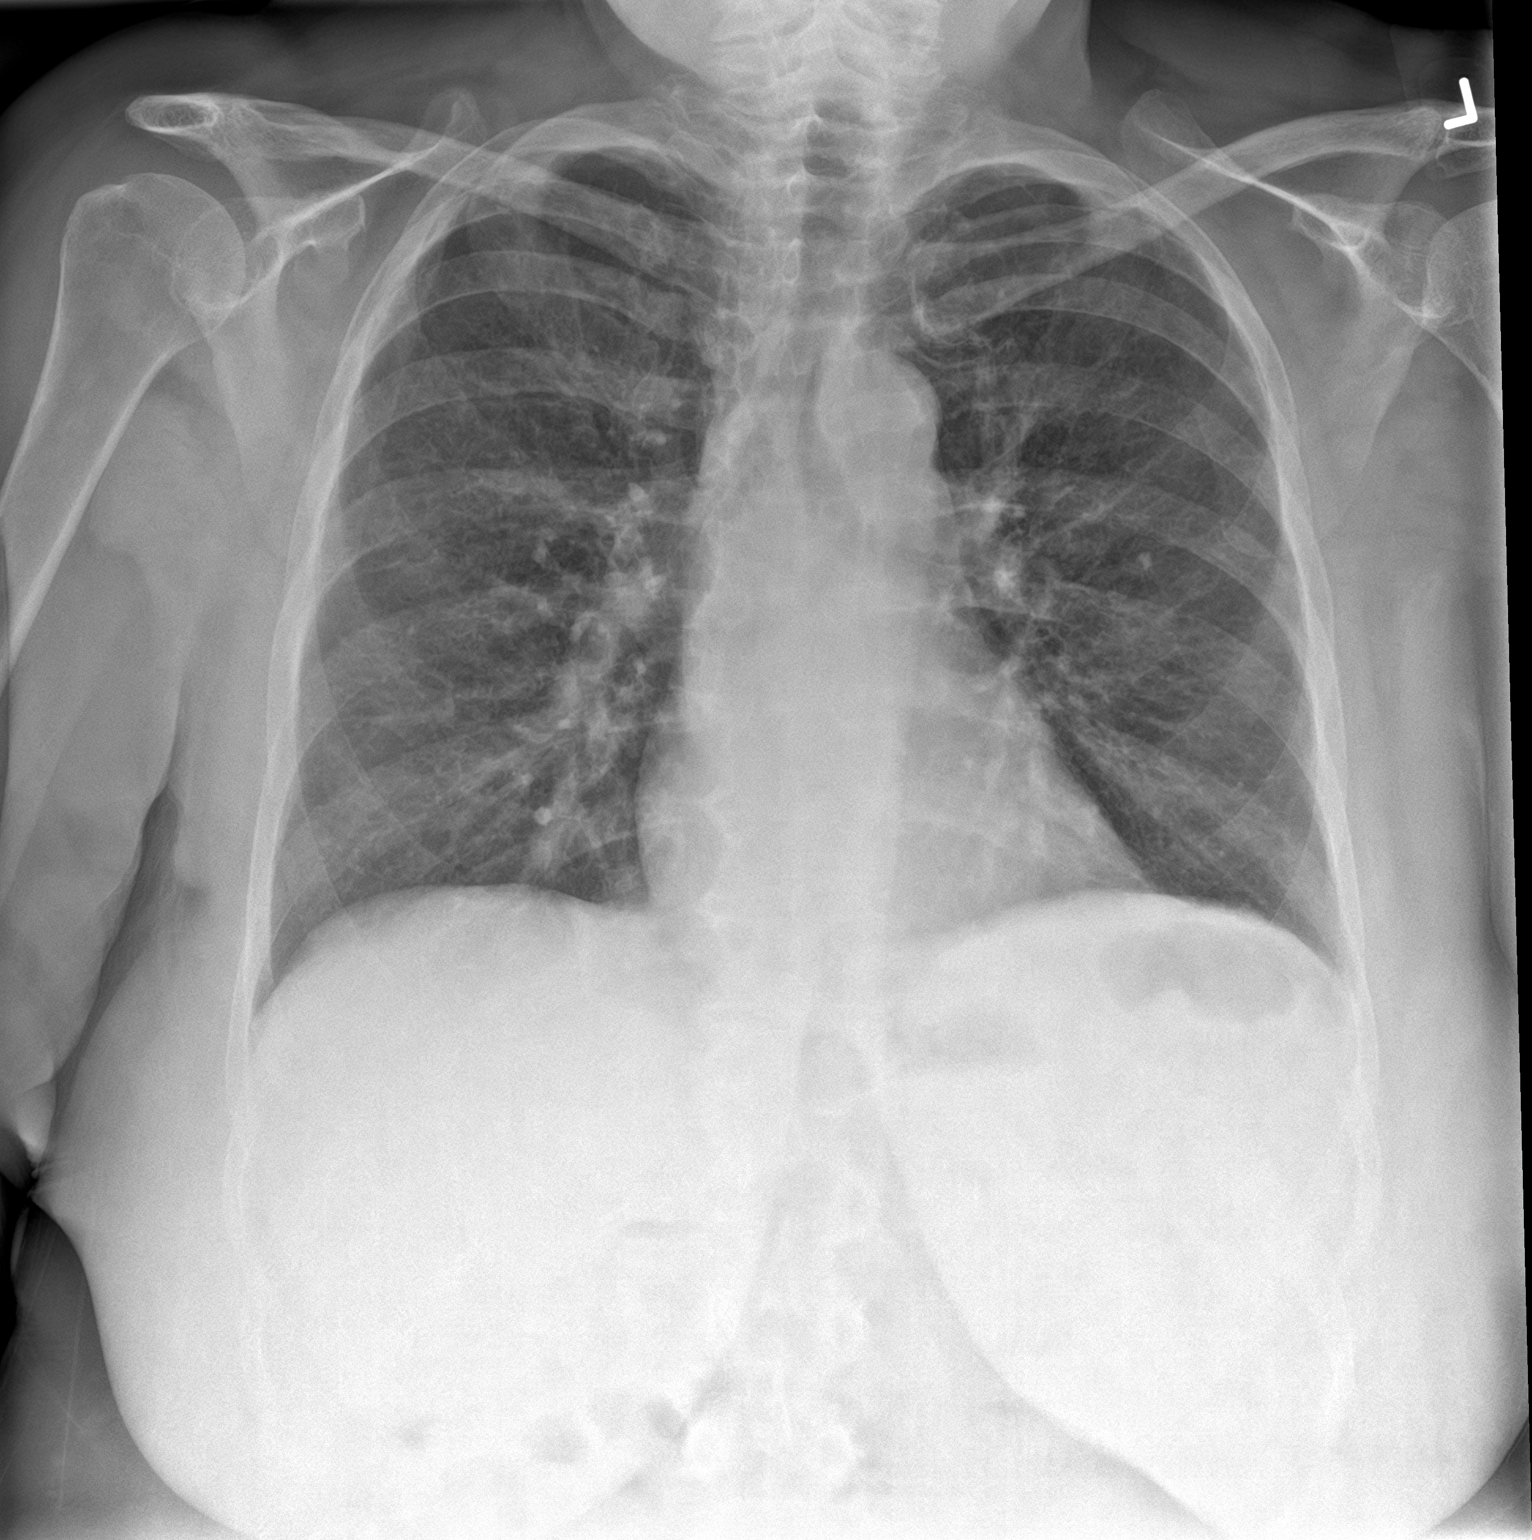

[chest lat]
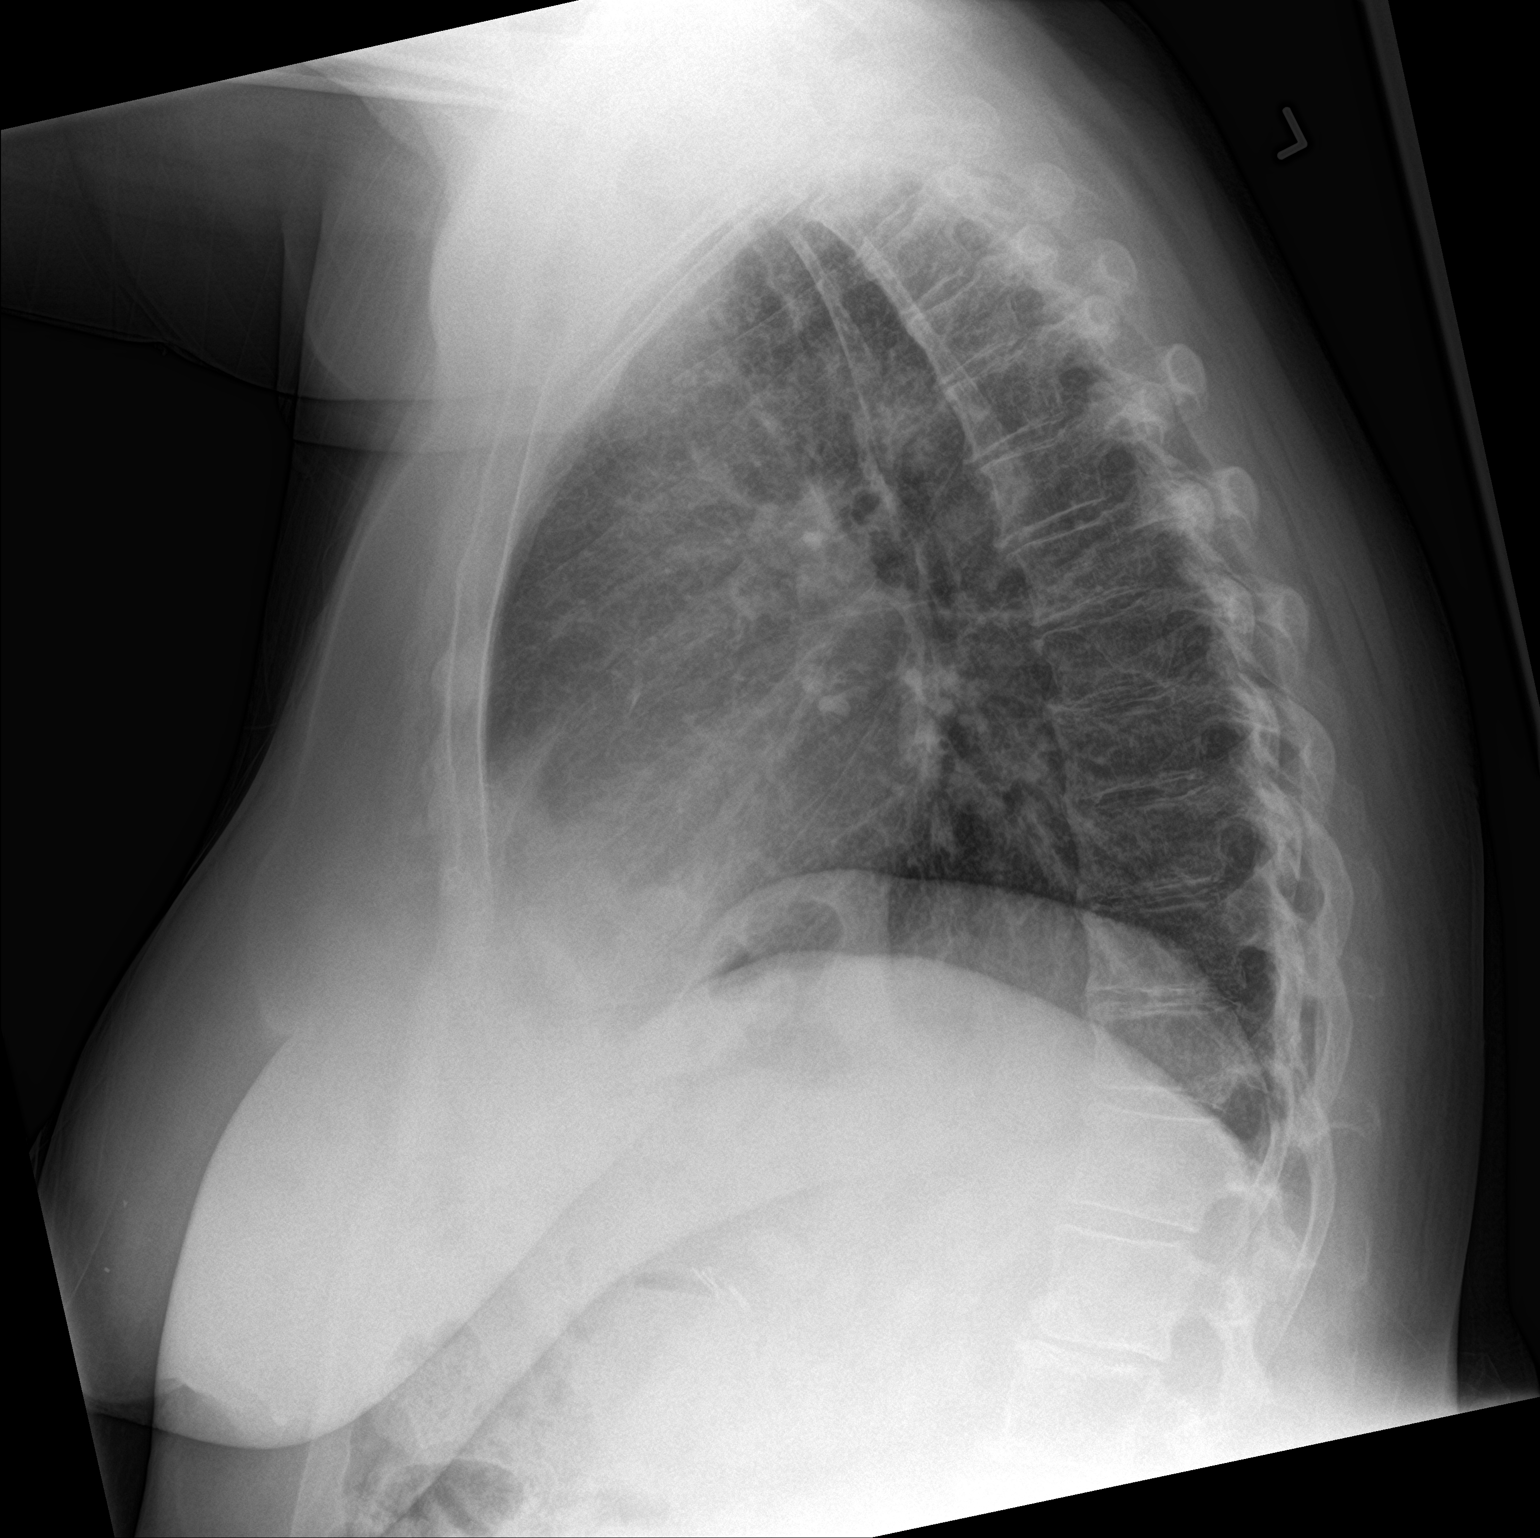

[2 of 2 positions shown; findings below may reference images not displayed]

FINDINGS: The heart size and mediastinal contours are within normal limits.
Both lungs are clear. The visualized skeletal structures are
unremarkable.
IMPRESSION: No active cardiopulmonary disease.

## 2021-12-24 ENCOUNTER — Ambulatory Visit (HOSPITAL_COMMUNITY)
Admission: EM | Admit: 2021-12-24 | Discharge: 2021-12-25 | Disposition: A | Discharge status: Other Acute Inpatient Hospital | Payer: Medicare Other | Attending: Nurse Practitioner | Admitting: Nurse Practitioner

## 2021-12-24 DIAGNOSIS — Z602 Problems related to living alone: Secondary | ICD-10-CM | POA: Diagnosis present

## 2021-12-24 DIAGNOSIS — F03918 Unspecified dementia, unspecified severity, with other behavioral disturbance: Secondary | ICD-10-CM | POA: Insufficient documentation

## 2021-12-24 DIAGNOSIS — Z7901 Long term (current) use of anticoagulants: Secondary | ICD-10-CM | POA: Insufficient documentation

## 2021-12-24 DIAGNOSIS — Z046 Encounter for general psychiatric examination, requested by authority: Secondary | ICD-10-CM

## 2021-12-24 DIAGNOSIS — F0390 Unspecified dementia without behavioral disturbance: Secondary | ICD-10-CM

## 2021-12-24 MED ORDER — CLONIDINE HCL 0.1 MG PO TABS
0.1000 mg | ORAL_TABLET | Freq: Once | ORAL | Status: AC
  Administered 2021-12-25: 0.1 mg via ORAL
  Filled 2021-12-24: qty 1
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
## 2021-12-24 NOTE — Discharge Instructions (Addendum)

## 2021-12-25 ENCOUNTER — Emergency Department (HOSPITAL_COMMUNITY)
Admission: EM | Admit: 2021-12-25 | Discharge: 2021-12-27 | Disposition: A | Discharge status: Home / Self Care | Payer: Medicare Other | Attending: Emergency Medicine | Admitting: Emergency Medicine

## 2021-12-25 DIAGNOSIS — I10 Essential (primary) hypertension: Secondary | ICD-10-CM | POA: Insufficient documentation

## 2021-12-25 DIAGNOSIS — F03918 Unspecified dementia, unspecified severity, with other behavioral disturbance: Secondary | ICD-10-CM | POA: Diagnosis not present

## 2021-12-25 DIAGNOSIS — Z79899 Other long term (current) drug therapy: Secondary | ICD-10-CM | POA: Diagnosis not present

## 2021-12-25 DIAGNOSIS — Z046 Encounter for general psychiatric examination, requested by authority: Secondary | ICD-10-CM

## 2021-12-25 DIAGNOSIS — Z20822 Contact with and (suspected) exposure to covid-19: Secondary | ICD-10-CM | POA: Diagnosis not present

## 2021-12-25 DIAGNOSIS — Z7901 Long term (current) use of anticoagulants: Secondary | ICD-10-CM | POA: Insufficient documentation

## 2021-12-25 DIAGNOSIS — J449 Chronic obstructive pulmonary disease, unspecified: Secondary | ICD-10-CM | POA: Insufficient documentation

## 2021-12-25 LAB — URINALYSIS, ROUTINE W REFLEX MICROSCOPIC
Bacteria, UA: NONE SEEN
Bilirubin Urine: NEGATIVE
Glucose, UA: NEGATIVE mg/dL
Hgb urine dipstick: NEGATIVE
Ketones, ur: 20 mg/dL — AB
Nitrite: NEGATIVE
Protein, ur: NEGATIVE mg/dL
Specific Gravity, Urine: 1.016 (ref 1.005–1.030)
pH: 6 (ref 5.0–8.0)

## 2021-12-25 LAB — CBC WITH DIFFERENTIAL/PLATELET
Abs Immature Granulocytes: 0.02 10*3/uL (ref 0.00–0.07)
Basophils Absolute: 0 10*3/uL (ref 0.0–0.1)
Basophils Relative: 1 %
Eosinophils Absolute: 0.1 10*3/uL (ref 0.0–0.5)
Eosinophils Relative: 1 %
HCT: 41.8 % (ref 36.0–46.0)
Hemoglobin: 12.4 g/dL (ref 12.0–15.0)
Immature Granulocytes: 0 %
Lymphocytes Relative: 28 %
Lymphs Abs: 2 10*3/uL (ref 0.7–4.0)
MCH: 26.2 pg (ref 26.0–34.0)
MCHC: 29.7 g/dL — ABNORMAL LOW (ref 30.0–36.0)
MCV: 88.2 fL (ref 80.0–100.0)
Monocytes Absolute: 0.5 10*3/uL (ref 0.1–1.0)
Monocytes Relative: 7 %
Neutro Abs: 4.4 10*3/uL (ref 1.7–7.7)
Neutrophils Relative %: 63 %
Platelets: 241 10*3/uL (ref 150–400)
RBC: 4.74 MIL/uL (ref 3.87–5.11)
RDW: 14.8 % (ref 11.5–15.5)
WBC: 7 10*3/uL (ref 4.0–10.5)
nRBC: 0 % (ref 0.0–0.2)

## 2021-12-25 LAB — COMPREHENSIVE METABOLIC PANEL
ALT: 13 U/L (ref 0–44)
AST: 20 U/L (ref 15–41)
Albumin: 3.7 g/dL (ref 3.5–5.0)
Alkaline Phosphatase: 94 U/L (ref 38–126)
Anion gap: 10 (ref 5–15)
BUN: 16 mg/dL (ref 8–23)
CO2: 25 mmol/L (ref 22–32)
Calcium: 9.3 mg/dL (ref 8.9–10.3)
Chloride: 106 mmol/L (ref 98–111)
Creatinine, Ser: 0.84 mg/dL (ref 0.44–1.00)
GFR, Estimated: >60 mL/min (ref >60)
Glucose, Bld: 119 mg/dL — ABNORMAL HIGH (ref 70–99)
Potassium: 3.7 mmol/L (ref 3.5–5.1)
Sodium: 141 mmol/L (ref 135–145)
Total Bilirubin: 0.6 mg/dL (ref 0.3–1.2)
Total Protein: 7.2 g/dL (ref 6.5–8.1)

## 2021-12-25 LAB — RESP PANEL BY RT-PCR (RSV, FLU A&B, COVID)  RVPGX2
Influenza A by PCR: NEGATIVE
Influenza B by PCR: NEGATIVE
Resp Syncytial Virus by PCR: NEGATIVE
SARS Coronavirus 2 by RT PCR: NEGATIVE

## 2021-12-25 LAB — RAPID URINE DRUG SCREEN, HOSP PERFORMED
Amphetamines: NOT DETECTED
Barbiturates: NOT DETECTED
Benzodiazepines: NOT DETECTED
Cocaine: NOT DETECTED
Opiates: NOT DETECTED
Tetrahydrocannabinol: NOT DETECTED

## 2021-12-25 LAB — ETHANOL: Alcohol, Ethyl (B): <10 mg/dL (ref <10)

## 2021-12-25 LAB — ACETAMINOPHEN LEVEL: Acetaminophen (Tylenol), Serum: <10 ug/mL — ABNORMAL LOW (ref 10–30)

## 2021-12-25 LAB — SALICYLATE LEVEL: Salicylate Lvl: <7 mg/dL — ABNORMAL LOW (ref 7.0–30.0)

## 2021-12-25 MED ORDER — ACETAMINOPHEN 325 MG PO TABS: 650.0000 mg | ORAL_TABLET | Freq: Four times a day (QID) | ORAL | Status: DC | PRN

## 2021-12-25 MED ORDER — APIXABAN 5 MG PO TABS
5.0000 mg | ORAL_TABLET | Freq: Two times a day (BID) | ORAL | Status: DC
  Administered 2021-12-25 – 2021-12-27 (×4): 5 mg via ORAL
  Filled 2021-12-25 (×5): qty 1

## 2021-12-25 MED ORDER — LOSARTAN POTASSIUM 50 MG PO TABS
100.0000 mg | ORAL_TABLET | Freq: Every day | ORAL | Status: DC
  Administered 2021-12-25 – 2021-12-27 (×3): 100 mg via ORAL
  Filled 2021-12-25 (×3): qty 2

## 2021-12-25 MED ORDER — BENZONATATE 100 MG PO CAPS
100.0000 mg | ORAL_CAPSULE | Freq: Once | ORAL | Status: AC
  Administered 2021-12-25: 100 mg via ORAL
  Filled 2021-12-25 (×2): qty 1

## 2021-12-25 MED ORDER — DONEPEZIL HCL 5 MG PO TABS
10.0000 mg | ORAL_TABLET | Freq: Every day | ORAL | Status: DC
  Administered 2021-12-25 – 2021-12-27 (×3): 10 mg via ORAL
  Filled 2021-12-25 (×3): qty 2

## 2021-12-25 NOTE — ED Notes (Signed)
Kelli Morgan, son, [phone] would like an update when available
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
## 2021-12-25 NOTE — Progress Notes (Signed)
TOC CSW received pts son, Allysha Tryon number [phone] from [place].  Vernia Buff and his wife stated they wanted pt to come live with them and not in a nursing home.  Vernia Buff stated that on 01/06/2022 he and his wife were coming to visit pt and would like to take her back with them then.  Jazlen Ogarro Tarpley-Carter, MSW, LCSW-A Pronouns:  She/Her/Hers [facility] Clinical Social Worker Direct Number:  [phone] [email or web address]
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
## 2021-12-25 NOTE — ED Notes (Signed)
Belongings placed in small locker #4

## 2021-12-25 NOTE — Progress Notes (Signed)
TOC CSW spoke with pts daughter, [name].  [name] was told that pt was ready for dc.  Christine then stated that she was moving out of pts home because she can no longer care for pt.  [name] also stated she has dual POA over pt.  [name] stated she will be bringing paperwork to hospital at about 9:30 pm tonight.  [name] also stated that she was looking into placement at [facility] for pt.  [facility] is in need of a FL2 to be faxed to [phone].  [name] stated she had an appt with pts PCP to do an FL2 but did not make it today.  She stated she has been trying to get pt into [facility] for the past two weeks.  [name] stated she will not be coming to pick up pt due to her families safety.  Rhian Asebedo Tarpley-Carter, MSW, LCSW-A Pronouns:  She/Her/Hers [facility] Clinical Social Worker Direct Number:  [phone] [email or web address]
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
## 2021-12-25 NOTE — ED Provider Notes (Incomplete)
Behavioral Health Urgent Care Medical Screening Exam  Patient Name: Kelli Morgan MRN: [id number] Date of Evaluation: 12/24/21 Chief Complaint:   Diagnosis:  Final diagnoses:  Involuntary commitment    History of Present illness: Kelli Morgan is a 76 y.o. female.     Psychiatric Specialty Exam  Presentation  General Appearance:Appropriate for Environment  Eye Contact:Good  Speech:Clear and Coherent  Speech Volume:Normal  Handedness:Right   Mood and Affect  Mood: Euthymic  Affect: Appropriate   Thought Process  Thought Processes: Coherent  Descriptions of Associations:Intact  Orientation:Full (Time, Place and Person)  Thought Content:WDL    Hallucinations:None  Ideas of Reference:None  Suicidal Thoughts:No  Homicidal Thoughts:No   Sensorium  Memory: Immediate Fair  Judgment: Fair  Insight: Fair   Executive Functions  Concentration: Good  Attention Span: Fair  Recall: Fair  Fund of Knowledge: Fair  Language: Good   Psychomotor Activity  Psychomotor Activity: Normal   Assets  Assets: Communication Skills; Desire for Improvement   Sleep  Sleep: Good  Number of hours: No data recorded  Nutritional Assessment (For OBS and FBC admissions only) Has the patient had a weight loss or gain of 10 pounds or more in the last 3 months?: No Has the patient had a decrease in food intake/or appetite?: No Does the patient have dental problems?: No Does the patient have eating habits or behaviors that may be indicators of an eating disorder including binging or inducing vomiting?: No Has the patient recently lost weight without trying?: 0 Has the patient been eating poorly because of a decreased appetite?: 0 Malnutrition Screening Tool Score: 0    Physical Exam: Physical Exam Constitutional:      General: She is not in acute distress.    Appearance: She is not diaphoretic.  HENT:     Head: Normocephalic.      Right Ear: External ear normal.     Left Ear: External ear normal.     Nose: No congestion.  Eyes:     General:        Right eye: No discharge.        Left eye: No discharge.  Cardiovascular:     Rate and Rhythm: Normal rate.  Pulmonary:     Effort: Pulmonary effort is normal. No respiratory distress.  Chest:     Chest wall: No tenderness.  Neurological:     Mental Status: She is alert. Mental status is at baseline.  Psychiatric:        Attention and Perception: Attention and perception normal.        Mood and Affect: Mood and affect normal.        Speech: Speech normal.        Behavior: Behavior is cooperative.        Thought Content: Thought content normal. Thought content is not paranoid or delusional. Thought content does not include homicidal or suicidal ideation. Thought content does not include homicidal or suicidal plan.        Cognition and Memory: Cognition normal.        Judgment: Judgment normal.    Review of Systems  Constitutional:  Negative for chills, diaphoresis and fever.  HENT:  Negative for congestion.   Eyes:  Negative for discharge.  Respiratory:  Negative for cough, shortness of breath and wheezing.   Cardiovascular:  Negative for chest pain and palpitations.  Gastrointestinal:  Negative for diarrhea, nausea and vomiting.  Neurological:  Negative for seizures, loss of consciousness, weakness and headaches.  Psychiatric/Behavioral:  Negative for depression, hallucinations, substance abuse and suicidal ideas. The patient is not nervous/anxious and does not have insomnia.    Blood pressure (!) 204/100, pulse 87, temperature 97.8 F (36.6 C), temperature source Oral, resp. rate 20, SpO2 96 %. There is no height or weight on file to calculate BMI.  Musculoskeletal: Strength & Muscle Tone: within normal limits Gait & Station: normal Patient leans: N/A   [facility] MSE Discharge Disposition for Follow up and Recommendations: Based on my evaluation, the patient  does not appear to have an emergency medical condition. Patient does not meet inpatient psychiatric admission criteria. Pt was IVCd's by her daughter   Mancel Bale, NP 12/24/2021, 11:46 PM

## 2021-12-25 NOTE — ED Provider Notes (Signed)
The [facility] EMERGENCY DEPARTMENT Provider Note   CSN: [id number] Arrival date & time: 12/25/21  0138     History  Chief Complaint  Patient presents with   Mental Health Problem    Kelli Morgan is a 76 y.o. female.  Transferred from [facility]. IVC taken out by daughter which states "Respondent has dementia.  Respondent is taking Eliquis, losartan, memantine, donepezil, risperidone, simvastatin, and melatonin.  Respondent told petitioner that she will get a gun and blow their brains out". Patient does not own or have access to any firearms. She denies SI/HI/AVH. No psychiatric history, per patient.  The history is provided by the patient and the police. No language interpreter was used.       Home Medications Prior to Admission medications   Medication Sig Start Date End Date Taking? Authorizing Provider  acetaminophen (TYLENOL) 325 MG tablet Take 2 tablets (650 mg total) by mouth every 6 (six) hours as needed for mild pain or headache. 07/30/19   Lurene Shadow, MD  albuterol (VENTOLIN HFA) 108 (90 Base) MCG/ACT inhaler Inhale 1-2 puffs into the lungs every 6 (six) hours as needed for wheezing or shortness of breath.    [provider]  apixaban (ELIQUIS) 5 MG TABS tablet Take 5 mg by mouth 2 (two) times daily.    [provider]  clopidogrel (PLAVIX) 75 MG tablet Take 1 tablet (75 mg total) by mouth daily with breakfast. 07/31/19   Lurene Shadow, MD  donepezil (ARICEPT) 10 MG tablet Take 1 tablet (10 mg total) by mouth daily. 07/30/19   Lurene Shadow, MD  gabapentin (NEURONTIN) 300 MG capsule Take 1 capsule (300 mg total) by mouth at bedtime. 11/24/19   Georgiana Spinner, NP  LATANOPROST OP Place 1 drop into both eyes daily.    [provider]  losartan (COZAAR) 100 MG tablet Take 100 mg by mouth daily.    [provider]  Vitamin D, Ergocalciferol, (DRISDOL) 1.25 MG (50000 UNIT) CAPS capsule Take 50,000 Units by mouth every 7  (seven) days.     [provider]      Allergies    Penicillins and Tape    Review of Systems   Review of Systems Ten systems reviewed and are negative for acute change, except as noted in the HPI.    Physical Exam Updated Vital Signs BP (!) 185/72 (BP Location: Right Arm)   Pulse 77   Temp 97.7 F (36.5 C)   Resp 17   Ht 5\' 5"  (1.651 m)   Wt 81.6 kg   SpO2 95%   BMI 29.95 kg/m  Physical Exam Vitals and nursing note reviewed.  Constitutional:      General: She is not in acute distress.    Appearance: She is well-developed. She is not diaphoretic.     Comments: Obese. Nontoxic appearing.  HENT:     Head: Normocephalic and atraumatic.  Eyes:     General: No scleral icterus.    Conjunctiva/sclera: Conjunctivae normal.  Pulmonary:     Effort: Pulmonary effort is normal. No respiratory distress.  Musculoskeletal:        General: Normal range of motion.     Cervical back: Normal range of motion.  Skin:    General: Skin is warm and dry.     Coloration: Skin is not pale.     Findings: No erythema or rash.  Neurological:     Mental Status: She is alert and oriented to person, place, and  time.     Comments: Ambulatory without assistance  Psychiatric:        Behavior: Behavior normal.     ED Results / Procedures / Treatments   Labs (all labs ordered are listed, but only abnormal results are displayed) Labs Reviewed  CBC WITH DIFFERENTIAL/PLATELET - Abnormal; Notable for the following components:      Result Value   MCHC 29.7 (*)    All other components within normal limits  COMPREHENSIVE METABOLIC PANEL - Abnormal; Notable for the following components:   Glucose, Bld 119 (*)    All other components within normal limits  ACETAMINOPHEN LEVEL - Abnormal; Notable for the following components:   Acetaminophen (Tylenol), Serum <10 (*)    All other components within normal limits  SALICYLATE LEVEL - Abnormal; Notable for the following components:   Salicylate  Lvl <7.0 (*)    All other components within normal limits  ETHANOL  RAPID URINE DRUG SCREEN, HOSP PERFORMED  URINALYSIS, ROUTINE W REFLEX MICROSCOPIC    EKG EKG Interpretation  Date/Time:  Wednesday December 25 2021 01:59:15 EST Ventricular Rate:  71 PR Interval:  176 QRS Duration: 94 QT Interval:  402 QTC Calculation: 436 R Axis:   48 Text Interpretation: Normal sinus rhythm Nonspecific ST abnormality Abnormal ECG When compared with ECG of 13-Jul-2020 17:52, No significant change was found Confirmed by Dione Booze ([id number]) on 12/25/2021 3:52:36 AM  Radiology No results found.  Procedures Procedures    Medications Ordered in ED Medications  apixaban (ELIQUIS) tablet 5 mg (has no administration in time range)  acetaminophen (TYLENOL) tablet 650 mg (has no administration in time range)  donepezil (ARICEPT) tablet 10 mg (has no administration in time range)  losartan (COZAAR) tablet 100 mg (has no administration in time range)    ED Course/ Medical Decision Making/ A&P                           Medical Decision Making Amount and/or Complexity of Data Reviewed Labs: ordered. ECG/medicine tests: ordered.  Risk OTC drugs. Prescription drug management.   This patient presents to the ED under involuntary commitment taken out by daughter. This involves an extensive number of treatment options, and is a complaint that carries with it a high risk of complications and morbidity.     Co morbidities that complicate the patient evaluation  DVT HTN COPD Afib   Additional history obtained:  Additional history obtained from police   Lab Tests:  I Ordered, and personally interpreted labs.  The pertinent results include:  normal CBC, CMP, ethanol, APAP and salicylate level   Cardiac Monitoring:  The patient was maintained on a cardiac monitor.  I personally viewed and interpreted the cardiac monitored which showed an underlying rhythm of: NSR   Medicines ordered  and prescription drug management:  I have reviewed the patients home medicines and have made adjustments as needed   Consultations Obtained:  I requested consultation with TTS - psychiatric assessment pending   Reevaluation:  After the interventions noted above, I reevaluated the patient and found that they have :stayed the same   Social Determinants of Health:  Insured patient   Dispostion:  Patient medically cleared and pending TTS evaluation. Care to be assumed by oncoming ED provider. Dispo per TTS.         Final Clinical Impression(s) / ED Diagnoses Final diagnoses:  Involuntary commitment    Rx / DC Orders ED Discharge Orders  None         Antony Madura, PA-C 12/25/21 3888    Dione Booze, MD 12/25/21 [phone]
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
## 2021-12-26 ENCOUNTER — Other Ambulatory Visit: Payer: Self-pay

## 2021-12-26 ENCOUNTER — Encounter (HOSPITAL_COMMUNITY): Payer: Self-pay

## 2021-12-26 NOTE — ED Notes (Signed)
Pt's Son called this morning and talked with PT.

## 2021-12-26 NOTE — Progress Notes (Signed)
TOC CSW received a call from [employer] [phone].  [name] had concerns about pt going to [employer] and wanted to know when the FL2 would be faxed.  CSW disclosed to [name] that [facility] doesn't do FL2's for placement unless there is a need for [place] to do that.  In this situation pt is in need of support at home.  Pt does not have a physical or medical need for placement.  CSW stated to [name] due to her inability and safety reasons for no longer being able to care for pt [place] [facility] are assisting with pts continued care.  Christine then asked since [place] DSS is becoming the guardians of pt does that mean she will lose her inheritance.  CSW disclosed to [name] that she was unable to answer that because she does not work for [employer] and does not know the legality of guardianship's or trusts.  CSW instructed [name] to speak with Jennelle Human at [facility] APS since he disclosed this information to her.  Christine then stated he would not speak with her or accept her calls.  Chirstine then thanked CSW for answering the phone and speaking with her.  End of call.  Jarah Pember Tarpley-Carter, MSW, LCSW-A Pronouns:  She/Her/Hers [facility] Clinical Social Worker Direct Number:  [phone] [email or web address]

## 2021-12-26 NOTE — ED Notes (Signed)
Pt's lunch has arrived, pt awoken from her nap. Pt sitting up on the side of their bed, eating their lunch

## 2021-12-26 NOTE — ED Provider Notes (Signed)
Emergency Medicine Observation Re-evaluation Note  Kelli Morgan is a 76 y.o. female, seen on rounds today.  Pt initially presented to the ED for complaints of ?hx dementia, and reportedly making threatening statement to family member who was seeking to have patient placed elsewhere (pt denies any allegations of harm to self or others). No new c/o this AM.   Physical Exam  BP (!) 140/59 (BP Location: Right Arm)   Pulse 66   Temp 98 F (36.7 C)   Resp 19   Ht 1.651 m (5\' 5" )   Wt 81.6 kg   SpO2 90%   BMI 29.95 kg/m  Physical Exam General: resting comfortably. Cardiac: regular rate. Lungs: breathing comfortably.  Psych: calm, resting.   ED Course / MDM    I have reviewed the labs performed to date as well as medications administered while in observation.  Recent changes in the last 24 hours include ED obs, TOC/SW consult, and facilitation of safe d/c plan.   Plan  Current plan is for other family member/son to come and get pt tomorrow so that she may live with them.     , MD 12/26/21 [phone]

## 2021-12-26 NOTE — ED Notes (Signed)
Pt's Son called to talk to Pt.
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
## 2021-12-26 NOTE — ED Notes (Signed)
Pt's breakfast has arrived, pt awoken from sleep to eat. Pt sitting on the side of the bed, eating her breakfast.

## 2021-12-26 NOTE — ED Notes (Signed)
Pt's dinner has arrived, pt sat up and eating her dinner now 

## 2021-12-26 NOTE — ED Notes (Signed)
Pt walked to the BR w/o any difficulty. Pt back in their room, lying on their bed.

## 2021-12-26 NOTE — ED Notes (Signed)
Pt talking to her son on the phone

## 2021-12-27 NOTE — ED Notes (Signed)
Son contacted via phone for timeframe of arrival, just landed in [place], will be here at ~ 1800.

## 2021-12-27 NOTE — Discharge Instructions (Addendum)
Return to the emergency department for any severe worsening symptoms  Substance Abuse Treatment Programs  Intensive Outpatient Programs [facility]     [address]      [place], [place]                   [phone]       [facility] [address] [place] [address] [place], [place] [phone]  [facility]     (Inpatient and outpatient)     [address]           [phone]    [facility] [phone] (Suboxone and Methadone)  [address]      [place], [place] [address]      [phone]       [address] [place], [place] [phone]  [facility] (Outpatient/Inpatient, Chemical)    (insurance only) [phone]             [facility] (Groups & Residential) [place], [place] [phone]     [facility]     [address]     [place], [place]      [phone]       [facility] (for caregivers and family) [phone] [address] Laurell [address] [place], [place] [phone]      Residential Treatment Programs [facility]      [address], [place], [place] [address]  [phone]       [facility] [address] [place], [place] [address] [phone]  [facility] [place]        [phone]       [facility] [phone]  [facility] ([facility]             [address]                                         [place], [place]                                                [phone] or [phone]                               [facility] [address] [place], [address] [phone]  [facility]    [address]      [place], [place]     [phone]       [facility] [address] [place], [place] [phone]  [facility]   [address] [place], [place] [address]     [phone]      Admissions: 8am-3pm  M-F  [facility] (RTS) [address] [place], [place] [phone]  [facility]: Residential Program [phone] Days)   [place], [place]      [phone] or [phone]     [facility]: [facility] [place], [place] (Walk in Hours over the weekend or by referral)  [facility] [address] [place], [place], [place] [address] [phone]  Crisis Mobile: [facility]:  [phone] (for crisis response 24 hours a day) [facility] Hotline:  [phone] Outpatient Psychiatry and Counseling  [facility]: Mobile Crisis Management 24 hours:  [phone]  [facility] [employer] sliding scale fee and walk in schedule: M-F 8am-12pm/1pm-3pm [place], [place] [address] [place], [place] [address] [phone]  [facility] ([facility])- new patient walk-in appointments available Monday - Friday 8am -3pm.          [address] [place], [place] [address] [phone] or crisis line- [facility]/ Intensive Outpatient Therapy Program [place], [place] [address] [place]      [phone] [address] [place], [place] [address]                 [place]   [facility] [phone]. [place], [place] [address]   [employer] of Care          [address] Johnette Abraham  [place], [place] [address]       [phone]  [place], [place], [place] [address] [phone]  [facility]    [address] [place], [place] [address]     [place], [place]     [place]  [address]     [phone]       [facility] [place] [address]  [facility]     [address], [place], [place], [place] [address] [place]     [address]     [place], [place] [address]     [phone]       [facility] [address] [place], [place] [address] [phone] Resources for Temporary Residential Assistance/Crisis [facility]) M-F 8am-3pm   [address] [place], [place] [address]   [phone] Services include: laundry, barbering, support groups, case management, phone  & computer access, showers, AA/NA mtgs, mental health/substance abuse nurse, job skills class, disability information, VA assistance, spiritual classes, etc.   [facility]   [address], [place]     [place] (women and children)       [place]. [place],  [address] [phone] [email or web address] for application and process Application Required  [facility]   [address]    [place] [address]     [phone]                    [place],  [address] [id number] application appt.) Application Required  [employer] [facility] (women only)    [place], [place]  [address]     [phone]      Intake starts 6pm daily Need valid ID, SSC, & Police report [employer] [address] [place], [place] [phone] Application Required  [facility] (men only)     [address]      [place], [place]     [phone]       [facility] (Pregnant  women only) [address] [place], [place] [phone]  [facility]      [address]      [place], [place] [address]     [phone]             [facility] [address] [place], [place] [phone] 90 day commitment/SA/Application process  [facility](men only)     [address]     [place], [place]     [phone]       Check-in at [facility] of [facility] [address] [place], [place] [address] [phone] Men/Women/Women and Children must be there by 7 pm  [facility] [place], [place] [phone]

## 2021-12-27 NOTE — ED Notes (Signed)
Up to b/r, steady gait 

## 2021-12-27 NOTE — ED Notes (Signed)
Patient's Son has arrived to pick patient up; Social worker requested with Patient's transition out of hospital; Daughter and family asked to leave due to safety concerns; Off duty and Security on stand by if needed at this time; Charge RN made aware of situation; Patient has been wheeled out with Son and daughter in law at side and with Security and NT. Pt is A&Ox 3-4 and no acute distress noted and agreeable with discharge plan-Monique,RN

## 2021-12-27 NOTE — ED Provider Notes (Signed)
Emergency Medicine Observation Re-evaluation Note  Kelli Morgan is a 76 y.o. female, seen on rounds today.  Pt initially presented to the ED for complaints of ?hx dementia, and reportedly making threatening statement to family member who was seeking to have patient placed elsewhere (pt denies any allegations of harm to self or others). No new c/o this AM.   Physical Exam  BP (!) 175/55 (BP Location: Right Arm)   Pulse 75   Temp 97.7 F (36.5 C) (Oral)   Resp 16   Ht 5\' 5"  (1.651 m)   Wt 81.6 kg   SpO2 96%   BMI 29.95 kg/m  Physical Exam General: resting comfortably. Cardiac: regular rate. Lungs: breathing comfortably.  Psych: calm, resting.   ED Course / MDM   There is an open APS case against patient's daughter . Not seeking emergency guardianship at this time. Per [place] [facility], patient is free to be DC'd to care of her son Celica Kotowski.   I have reviewed the labs performed to date as well as medications administered while in observation.  Recent changes in the last 24 hours include ED obs, TOC/SW consult, and facilitation of safe d/c plan.   Plan  Current plan is for Wilkie Aye to come and get patient today so that she may live with them. His flight reportedly lands at 3 pm today     Vernia Buff, MD 12/27/21 [phone]

## 2021-12-27 NOTE — ED Notes (Signed)
Labile behavior and emotions. Pleasant and kind, asking why she is here. Explained with rationale.

## 2021-12-27 NOTE — ED Notes (Signed)
Chart review/discussion w/RN:  IVC'd 12/24/21; expiration 12/31/21. IVC documentation in purple zone.

## 2021-12-27 NOTE — ED Notes (Signed)
Pt became beligerant, derogatory, agitated when she wasn't able to be given a knife. No knife explained with rationale. Offered to cut meet up for her. She adamantly refused. Stated she did not want food. Did not want to talk. Dismissed this Charity fundraiser. Continues to grumble. Sitter present. Resting in bed watching TV.

## 2021-12-27 NOTE — ED Notes (Signed)
Fax'd clerk of court for rescind.

## 2021-12-27 NOTE — ED Provider Notes (Signed)
I have overturned the involuntary commitment, has been cleared for discharge, family member on way to pick her up   Eber Hong, MD 12/27/21 1650

## 2021-12-27 NOTE — ED Notes (Signed)
Daughter and SIL visiting at [facility]
